# Patient Record
Sex: Male | Born: 1980 | Race: Black or African American | Hispanic: No | Marital: Single | Smoking: Never smoker
Health system: Southern US, Community
[De-identification: ages and names within clinical notes are randomized; demographics above are authoritative.]

## PROBLEM LIST (undated history)

## (undated) DIAGNOSIS — F840 Autistic disorder: Secondary | ICD-10-CM

---

## 2014-07-30 ENCOUNTER — Telehealth: Payer: Self-pay | Admitting: Family Medicine

## 2014-07-30 NOTE — Telephone Encounter (Signed)
Pt given appt with Lynwood Dawleyiffany Gann 4/20 at 10:00, pt aware to arrive 30 minutes prior with a copy of insurance card. Pt is autistic and needs a physical to perform in the [employer]. He isn't on any current medications.

## 2014-08-01 ENCOUNTER — Encounter: Payer: Self-pay | Admitting: Physician Assistant

## 2014-08-01 ENCOUNTER — Ambulatory Visit (INDEPENDENT_AMBULATORY_CARE_PROVIDER_SITE_OTHER): Payer: Medicaid Other | Admitting: Physician Assistant

## 2014-08-01 ENCOUNTER — Encounter (INDEPENDENT_AMBULATORY_CARE_PROVIDER_SITE_OTHER): Payer: Self-pay

## 2014-08-01 VITALS — BP 131/76 | HR 71 | Temp 97.3°F | Ht 72.0 in | Wt 209.0 lb

## 2014-08-01 DIAGNOSIS — Z0000 Encounter for general adult medical examination without abnormal findings: Secondary | ICD-10-CM

## 2014-08-01 DIAGNOSIS — Z025 Encounter for examination for participation in sport: Secondary | ICD-10-CM

## 2014-08-01 NOTE — Progress Notes (Signed)
   Subjective:    Patient ID: Donald Gallagher, male    DOB: [birth year], 34 y.o.   MRN: [id number]  HPI 34 y/o male presents for wellness exam prior to participating in the special Olympics. Asymptomatic today     Review of Systems  Constitutional: Negative.   HENT: Negative.   Eyes: Negative.   Respiratory: Negative.   Cardiovascular: Negative.   Gastrointestinal: Negative.   Endocrine: Negative.   Genitourinary: Negative.   Musculoskeletal: Negative.   Hematological: Negative.   Psychiatric/Behavioral: Negative.        Objective:   Physical Exam  Constitutional: He appears well-developed and well-nourished. No distress.  Pleasant   HENT:  Head: Normocephalic and atraumatic.  Right Ear: External ear normal.  Left Ear: External ear normal.  Mouth/Throat: Oropharynx is clear and moist.  Eyes: Right eye exhibits no discharge. Left eye exhibits no discharge.  Difficulty with following light Bilateral pupils ERRL  Neck: Normal range of motion.  Cardiovascular: Normal rate and regular rhythm.  Exam reveals no gallop and no friction rub.   No murmur heard. Abdominal: Soft. Bowel sounds are normal. He exhibits no distension and no mass. There is no tenderness. There is no rebound and no guarding.  Musculoskeletal: Normal range of motion.  Neurological: He is alert. Coordination abnormal.  Unable to follow all commands Difficulty concentrating  Skin: No rash noted. He is not diaphoretic. No erythema. No pallor.  Psychiatric: His behavior is normal.  Nursing note and vitals reviewed.         Assessment & Plan:  1. Encounter for examination to participate in special olympics Patient able to participate as normal with no restrictions.     Maitland Muhlbauer A. Harvir Patry PA-C    Donovin Kraemer A. Chauncey ReadingGann PA-C

## 2015-05-30 ENCOUNTER — Ambulatory Visit (INDEPENDENT_AMBULATORY_CARE_PROVIDER_SITE_OTHER): Payer: Medicaid Other | Admitting: Family

## 2015-05-30 ENCOUNTER — Encounter: Payer: Self-pay | Admitting: Family

## 2015-05-30 VITALS — BP 123/75 | HR 97 | Temp 97.2°F | Ht 72.0 in | Wt 209.0 lb

## 2015-05-30 DIAGNOSIS — F840 Autistic disorder: Secondary | ICD-10-CM

## 2015-05-30 DIAGNOSIS — J069 Acute upper respiratory infection, unspecified: Secondary | ICD-10-CM

## 2015-05-30 DIAGNOSIS — R6889 Other general symptoms and signs: Secondary | ICD-10-CM

## 2015-05-30 LAB — POCT INFLUENZA A/B
INFLUENZA A, POC: NEGATIVE
Influenza B, POC: NEGATIVE

## 2015-05-30 LAB — POCT RAPID STREP A (OFFICE): Rapid Strep A Screen: NEGATIVE

## 2015-05-30 MED ORDER — AZITHROMYCIN 250 MG PO TABS: ORAL_TABLET | ORAL | Status: DC

## 2015-05-30 MED ORDER — MOMETASONE FUROATE 50 MCG/ACT NA SUSP: 2.0000 | Freq: Every day | NASAL | Status: DC

## 2015-05-30 NOTE — Progress Notes (Signed)
Subjective:    Patient ID: Donald Gallagher, male    DOB: 05/24/1980, 35 y.o.   MRN: [id number]  Pt is autistic and presents to the office today with mother with complaints of sore throat and fever.  Fever  This is a new problem. The current episode started in the past 7 days (Saturday). The problem has been rapidly worsening. Associated symptoms include congestion, coughing, muscle aches, a sore throat and vomiting. Pertinent negatives include no diarrhea, headaches or nausea.  Cough Associated symptoms include a fever and a sore throat. Pertinent negatives include no headaches.  Sore Throat  This is a new problem. The current episode started in the past 7 days. The problem has been gradually worsening. The pain is moderate. Associated symptoms include congestion, coughing, swollen glands and vomiting. Pertinent negatives include no diarrhea or headaches. He has had no exposure to strep. He has tried acetaminophen and NSAIDs for the symptoms. The treatment provided moderate relief.  Emesis  Associated symptoms include coughing and a fever. Pertinent negatives include no diarrhea or headaches.      Review of Systems  Constitutional: Positive for fever.  HENT: Positive for congestion and sore throat.   Respiratory: Positive for cough.   Cardiovascular: Negative.   Gastrointestinal: Positive for vomiting. Negative for nausea and diarrhea.  Endocrine: Negative.   Genitourinary: Negative.   Musculoskeletal: Negative.   Neurological: Negative.  Negative for headaches.  Hematological: Negative.   Psychiatric/Behavioral: Negative.   All other systems reviewed and are negative.      Objective:   Physical Exam  Constitutional: He is oriented to person, place, and time. He appears well-developed and well-nourished. No distress.  HENT:  Head: Normocephalic.  Right Ear: External ear normal.  Left Ear: External ear normal.  Nasal passage erythemas with mild swelling  Oropharynx erythemas    Eyes: Pupils are equal, round, and reactive to light. Right eye exhibits no discharge. Left eye exhibits no discharge.  Neck: Normal range of motion. Neck supple. No thyromegaly present.  Cardiovascular: Normal rate, regular rhythm, normal heart sounds and intact distal pulses.   No murmur heard. Pulmonary/Chest: Effort normal and breath sounds normal. No respiratory distress. He has no wheezes.  Abdominal: Soft. Bowel sounds are normal. He exhibits no distension. There is no tenderness.  Musculoskeletal: Normal range of motion. He exhibits no edema or tenderness.  Neurological: He is alert and oriented to person, place, and time. He has normal reflexes. No cranial nerve deficit.  Skin: Skin is warm and dry. No rash noted. No erythema.  Psychiatric: He has a normal mood and affect. His behavior is normal. Judgment and thought content normal.  Vitals reviewed.     BP 123/75 mmHg  Pulse 97  Temp(Src) 97.2 F (36.2 C) (Oral)  Ht 6' (1.829 m)  Wt 209 lb (94.802 kg)  BMI 28.34 kg/m2     Assessment & Plan:  1. Flu-like symptoms - POCT Influenza A/B - POCT rapid strep A  2. Autistic disorder  3. Acute upper respiratory infection -- Take meds as prescribed - Use a cool mist humidifier  -Use saline nose sprays frequently -Saline irrigations of the nose can be very helpful if done frequently.  * 4X daily for 1 week*  * Use of a nettie pot can be helpful with this. Follow directions with this* -Force fluids -For any cough or congestion  Use plain Mucinex- regular strength or max strength is fine   * Children- consult with Pharmacist for dosing -For  fever or aces or pains- take tylenol or ibuprofen appropriate for age and weight.  * for fevers greater than 101 orally you may alternate ibuprofen and tylenol every  3 hours. -Throat lozenges if help -New toothbrush in 3 days -RTO prn - azithromycin (ZITHROMAX Z-PAK) 250 MG tablet; As directed  Dispense: 1 each; Refill: 0 -  mometasone (NASONEX) 50 MCG/ACT nasal spray; Place 2 sprays into the nose daily.  Dispense: 17 g; Refill: 12   Jannifer Rodney, FNP

## 2015-05-30 NOTE — Patient Instructions (Signed)
Upper Respiratory Infection, Adult Most upper respiratory infections (URIs) are a viral infection of the air passages leading to the lungs. A URI affects the nose, throat, and upper air passages. The most common type of URI is nasopharyngitis and is typically referred to as "the common cold." URIs run their course and usually go away on their own. Most of the time, a URI does not require medical attention, but sometimes a bacterial infection in the upper airways can follow a viral infection. This is called a secondary infection. Sinus and middle ear infections are common types of secondary upper respiratory infections. Bacterial pneumonia can also complicate a URI. A URI can worsen asthma and chronic obstructive pulmonary disease (COPD). Sometimes, these complications can require emergency medical care and may be life threatening.  CAUSES Almost all URIs are caused by viruses. A virus is a type of germ and can spread from one person to another.  RISKS FACTORS You may be at risk for a URI if:   You smoke.   You have chronic heart or lung disease.  You have a weakened defense (immune) system.   You are very young or very old.   You have nasal allergies or asthma.  You work in crowded or poorly ventilated areas.  You work in health care facilities or schools. SIGNS AND SYMPTOMS  Symptoms typically develop 2-3 days after you come in contact with a cold virus. Most viral URIs last 7-10 days. However, viral URIs from the influenza virus (flu virus) can last 14-18 days and are typically more severe. Symptoms may include:   Runny or stuffy (congested) nose.   Sneezing.   Cough.   Sore throat.   Headache.   Fatigue.   Fever.   Loss of appetite.   Pain in your forehead, behind your eyes, and over your cheekbones (sinus pain).  Muscle aches.  DIAGNOSIS  Your health care provider may diagnose a URI by:  Physical exam.  Tests to check that your symptoms are not due to  another condition such as:  Strep throat.  Sinusitis.  Pneumonia.  Asthma. TREATMENT  A URI goes away on its own with time. It cannot be cured with medicines, but medicines may be prescribed or recommended to relieve symptoms. Medicines may help:  Reduce your fever.  Reduce your cough.  Relieve nasal congestion. HOME CARE INSTRUCTIONS   Take medicines only as directed by your health care provider.   Gargle warm saltwater or take cough drops to comfort your throat as directed by your health care provider.  Use a warm mist humidifier or inhale steam from a shower to increase air moisture. This may make it easier to breathe.  Drink enough fluid to keep your urine clear or pale yellow.   Eat soups and other clear broths and maintain good nutrition.   Rest as needed.   Return to work when your temperature has returned to normal or as your health care provider advises. You may need to stay home longer to avoid infecting others. You can also use a face mask and careful hand washing to prevent spread of the virus.  Increase the usage of your inhaler if you have asthma.   Do not use any tobacco products, including cigarettes, chewing tobacco, or electronic cigarettes. If you need help quitting, ask your health care provider. PREVENTION  The best way to protect yourself from getting a cold is to practice good hygiene.   Avoid oral or hand contact with people with cold   symptoms.   Wash your hands often if contact occurs.  There is no clear evidence that vitamin C, vitamin E, echinacea, or exercise reduces the chance of developing a cold. However, it is always recommended to get plenty of rest, exercise, and practice good nutrition.  SEEK MEDICAL CARE IF:   You are getting worse rather than better.   Your symptoms are not controlled by medicine.   You have chills.  You have worsening shortness of breath.  You have brown or red mucus.  You have yellow or brown nasal  discharge.  You have pain in your face, especially when you bend forward.  You have a fever.  You have swollen neck glands.  You have pain while swallowing.  You have white areas in the back of your throat. SEEK IMMEDIATE MEDICAL CARE IF:   You have severe or persistent:  Headache.  Ear pain.  Sinus pain.  Chest pain.  You have chronic lung disease and any of the following:  Wheezing.  Prolonged cough.  Coughing up blood.  A change in your usual mucus.  You have a stiff neck.  You have changes in your:  Vision.  Hearing.  Thinking.  Mood. MAKE SURE YOU:   Understand these instructions.  Will watch your condition.  Will get help right away if you are not doing well or get worse.   This information is not intended to replace advice given to you by your health care provider. Make sure you discuss any questions you have with your health care provider.   Document Released: 09/23/2000 Document Revised: 08/14/2014 Document Reviewed: 07/05/2013 Elsevier Interactive Patient Education 2016 Elsevier Inc.  

## 2016-08-03 ENCOUNTER — Encounter: Payer: Self-pay | Admitting: Family

## 2016-08-03 ENCOUNTER — Ambulatory Visit (INDEPENDENT_AMBULATORY_CARE_PROVIDER_SITE_OTHER): Payer: Medicaid Other | Admitting: Family

## 2016-08-03 VITALS — BP 129/83 | HR 79 | Temp 97.2°F | Ht 70.5 in | Wt 209.4 lb

## 2016-08-03 DIAGNOSIS — Z23 Encounter for immunization: Secondary | ICD-10-CM

## 2016-08-03 DIAGNOSIS — F840 Autistic disorder: Secondary | ICD-10-CM

## 2016-08-03 DIAGNOSIS — Z0000 Encounter for general adult medical examination without abnormal findings: Secondary | ICD-10-CM

## 2016-08-03 NOTE — Progress Notes (Addendum)
   Subjective:    Patient ID: Donald Gallagher, male    DOB: Sep 16, 1980, 36 y.o.   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 PT is autisic, but currently not taking medications at this time. Pt denies any headache, palpitations, SOB, or edema at this time.    Review of Systems  All other systems reviewed and are negative.      Objective:   Physical Exam  Constitutional: He is oriented to person, place, and time. He appears well-developed and well-nourished. No distress.  HENT:  Head: Normocephalic.  Right Ear: External ear normal.  Left Ear: External ear normal.  Nose: Nose normal.  Mouth/Throat: Oropharynx is clear and moist.  Eyes: Pupils are equal, round, and reactive to light. Right eye exhibits no discharge. Left eye exhibits no discharge.  Neck: Normal range of motion. Neck supple. No thyromegaly present.  Cardiovascular: Normal rate, regular rhythm, normal heart sounds and intact distal pulses.   No murmur heard. Pulmonary/Chest: Effort normal and breath sounds normal. No respiratory distress. He has no wheezes.  Abdominal: Soft. Bowel sounds are normal. He exhibits no distension. There is no tenderness.  Musculoskeletal: Normal range of motion. He exhibits no edema or tenderness.  Neurological: He is alert and oriented to person, place, and time.  Skin: Skin is warm and dry. No rash noted. No erythema.  Psychiatric: He has a normal mood and affect. His behavior is normal. Judgment and thought content normal.  Vitals reviewed.   BP 129/83   Pulse 79   Temp 97.2 F (36.2 C) (Oral)   Ht 5' 10.5" (1.791 m)   Wt 209 lb 6.4 oz (95 kg)   BMI 29.62 kg/m        Assessment & Plan:  1. Autistic disorder - CMP14+EGFR  2. Annual physical exam - CMP14+EGFR - Lipid panel - Thyroid Panel With TSH - CBC with Differential/Platelet   Continue all meds Labs pending Health Maintenance reviewed Diet and  exercise encouraged RTO 1 year and noted given to participate in [place], [place]

## 2016-08-03 NOTE — Patient Instructions (Signed)
 Health Maintenance, Male A healthy lifestyle and preventive care is important for your health and wellness. Ask your health care provider about what schedule of regular examinations is right for you. What should I know about weight and diet?  Eat a Healthy Diet  Eat plenty of vegetables, fruits, whole grains, low-fat dairy products, and lean protein.  Do not eat a lot of foods high in solid fats, added sugars, or salt. Maintain a Healthy Weight  Regular exercise can help you achieve or maintain a healthy weight. You should:  Do at least 150 minutes of exercise each week. The exercise should increase your heart rate and make you sweat (moderate-intensity exercise).  Do strength-training exercises at least twice a week. Watch Your Levels of Cholesterol and Blood Lipids  Have your blood tested for lipids and cholesterol every 5 years starting at 37 years of age. If you are at high risk for heart disease, you should start having your blood tested when you are 37 years old. You may need to have your cholesterol levels checked more often if:  Your lipid or cholesterol levels are high.  You are older than 37 years of age.  You are at high risk for heart disease. What should I know about cancer screening? Many types of cancers can be detected early and may often be prevented. Lung Cancer  You should be screened every year for lung cancer if:  You are a current smoker who has smoked for at least 30 years.  You are a former smoker who has quit within the past 15 years.  Talk to your health care provider about your screening options, when you should start screening, and how often you should be screened. Colorectal Cancer  Routine colorectal cancer screening usually begins at 37 years of age and should be repeated every 5-10 years until you are 37 years old. You may need to be screened more often if early forms of precancerous polyps or small growths are found. Your health care provider  may recommend screening at an earlier age if you have risk factors for colon cancer.  Your health care provider may recommend using home test kits to check for hidden blood in the stool.  A small camera at the end of a tube can be used to examine your colon (sigmoidoscopy or colonoscopy). This checks for the earliest forms of colorectal cancer. Prostate and Testicular Cancer  Depending on your age and overall health, your health care provider may do certain tests to screen for prostate and testicular cancer.  Talk to your health care provider about any symptoms or concerns you have about testicular or prostate cancer. Skin Cancer  Check your skin from head to toe regularly.  Tell your health care provider about any new moles or changes in moles, especially if:  There is a change in a mole's size, shape, or color.  You have a mole that is larger than a pencil eraser.  Always use sunscreen. Apply sunscreen liberally and repeat throughout the day.  Protect yourself by wearing long sleeves, pants, a wide-brimmed hat, and sunglasses when outside. What should I know about heart disease, diabetes, and high blood pressure?  If you are 18-39 years of age, have your blood pressure checked every 3-5 years. If you are 40 years of age or older, have your blood pressure checked every year. You should have your blood pressure measured twice-once when you are at a hospital or clinic, and once when you are not at   a hospital or clinic. Record the average of the two measurements. To check your blood pressure when you are not at a hospital or clinic, you can use:  An automated blood pressure machine at a pharmacy.  A home blood pressure monitor.  Talk to your health care provider about your target blood pressure.  If you are between 45-79 years old, ask your health care provider if you should take aspirin to prevent heart disease.  Have regular diabetes screenings by checking your fasting blood sugar  level.  If you are at a normal weight and have a low risk for diabetes, have this test once every three years after the age of 45.  If you are overweight and have a high risk for diabetes, consider being tested at a younger age or more often.  A one-time screening for abdominal aortic aneurysm (AAA) by ultrasound is recommended for men aged 65-75 years who are current or former smokers. What should I know about preventing infection? Hepatitis B  If you have a higher risk for hepatitis B, you should be screened for this virus. Talk with your health care provider to find out if you are at risk for hepatitis B infection. Hepatitis C  Blood testing is recommended for:  Everyone born from 1945 through 1965.  Anyone with known risk factors for hepatitis C. Sexually Transmitted Diseases (STDs)  You should be screened each year for STDs including gonorrhea and chlamydia if:  You are sexually active and are younger than 37 years of age.  You are older than 37 years of age and your health care provider tells you that you are at risk for this type of infection.  Your sexual activity has changed since you were last screened and you are at an increased risk for chlamydia or gonorrhea. Ask your health care provider if you are at risk.  Talk with your health care provider about whether you are at high risk of being infected with HIV. Your health care provider may recommend a prescription medicine to help prevent HIV infection. What else can I do?  Schedule regular health, dental, and eye exams.  Stay current with your vaccines (immunizations).  Do not use any tobacco products, such as cigarettes, chewing tobacco, and e-cigarettes. If you need help quitting, ask your health care provider.  Limit alcohol intake to no more than 2 drinks per day. One drink equals 12 ounces of beer, 5 ounces of wine, or 1 ounces of hard liquor.  Do not use street drugs.  Do not share needles.  Ask your health  care provider for help if you need support or information about quitting drugs.  Tell your health care provider if you often feel depressed.  Tell your health care provider if you have ever been abused or do not feel safe at home. This information is not intended to replace advice given to you by your health care provider. Make sure you discuss any questions you have with your health care provider. Document Released: 09/26/2007 Document Revised: 11/27/2015 Document Reviewed: 01/01/2015 Elsevier Interactive Patient Education  2017 Elsevier Inc.  

## 2016-08-03 NOTE — Addendum Note (Signed)
Addended by: Almeta Monas on: 08/03/2016 12:58 PM   Modules accepted: Orders

## 2016-08-04 LAB — CBC WITH DIFFERENTIAL/PLATELET
BASOS ABS: 0 10*3/uL (ref 0.0–0.2)
Basos: 0 %
EOS (ABSOLUTE): 0.1 10*3/uL (ref 0.0–0.4)
Eos: 3 %
Hematocrit: 41.8 % (ref 37.5–51.0)
Hemoglobin: 14.3 g/dL (ref 13.0–17.7)
Immature Grans (Abs): 0 10*3/uL (ref 0.0–0.1)
Immature Granulocytes: 0 %
LYMPHS ABS: 2.1 10*3/uL (ref 0.7–3.1)
Lymphs: 40 %
MCH: 27.6 pg (ref 26.6–33.0)
MCHC: 34.2 g/dL (ref 31.5–35.7)
MCV: 81 fL (ref 79–97)
MONOS ABS: 0.6 10*3/uL (ref 0.1–0.9)
Monocytes: 11 %
NEUTROS ABS: 2.4 10*3/uL (ref 1.4–7.0)
Neutrophils: 46 %
Platelets: 224 10*3/uL (ref 150–379)
RBC: 5.19 x10E6/uL (ref 4.14–5.80)
RDW: 13.7 % (ref 12.3–15.4)
WBC: 5.2 10*3/uL (ref 3.4–10.8)

## 2016-08-04 LAB — CMP14+EGFR
A/G RATIO: 1.5 (ref 1.2–2.2)
ALT: 26 IU/L (ref 0–44)
AST: 20 IU/L (ref 0–40)
Albumin: 4.4 g/dL (ref 3.5–5.5)
Alkaline Phosphatase: 86 IU/L (ref 39–117)
BILIRUBIN TOTAL: 0.3 mg/dL (ref 0.0–1.2)
BUN / CREAT RATIO: 11 (ref 9–20)
BUN: 10 mg/dL (ref 6–20)
CHLORIDE: 102 mmol/L (ref 96–106)
CO2: 21 mmol/L (ref 18–29)
Calcium: 9.1 mg/dL (ref 8.7–10.2)
Creatinine, Ser: 0.93 mg/dL (ref 0.76–1.27)
GFR calc non Af Amer: 105 mL/min/{1.73_m2} (ref >59)
GFR, EST AFRICAN AMERICAN: 122 mL/min/{1.73_m2} (ref >59)
GLOBULIN, TOTAL: 2.9 g/dL (ref 1.5–4.5)
Glucose: 90 mg/dL (ref 65–99)
POTASSIUM: 4.4 mmol/L (ref 3.5–5.2)
SODIUM: 141 mmol/L (ref 134–144)
TOTAL PROTEIN: 7.3 g/dL (ref 6.0–8.5)

## 2016-08-04 LAB — LIPID PANEL
Chol/HDL Ratio: 4.4 ratio (ref 0.0–5.0)
Cholesterol, Total: 208 mg/dL — ABNORMAL HIGH (ref 100–199)
HDL: 47 mg/dL (ref >39)
Triglycerides: 426 mg/dL — ABNORMAL HIGH (ref 0–149)

## 2016-08-04 LAB — THYROID PANEL WITH TSH
FREE THYROXINE INDEX: 1.6 (ref 1.2–4.9)
T3 UPTAKE RATIO: 27 % (ref 24–39)
T4 TOTAL: 6 ug/dL (ref 4.5–12.0)
TSH: 2.08 u[IU]/mL (ref 0.450–4.500)

## 2017-02-17 ENCOUNTER — Ambulatory Visit (INDEPENDENT_AMBULATORY_CARE_PROVIDER_SITE_OTHER): Payer: Medicaid Other

## 2017-02-17 DIAGNOSIS — Z23 Encounter for immunization: Secondary | ICD-10-CM | POA: Diagnosis not present

## 2017-07-19 ENCOUNTER — Encounter: Payer: Self-pay | Admitting: Nurse Practitioner

## 2017-07-19 ENCOUNTER — Ambulatory Visit: Payer: Medicaid Other | Admitting: Nurse Practitioner

## 2017-07-19 VITALS — BP 125/78 | HR 84 | Temp 96.6°F | Ht 70.0 in | Wt 225.0 lb

## 2017-07-19 DIAGNOSIS — R05 Cough: Secondary | ICD-10-CM

## 2017-07-19 DIAGNOSIS — R059 Cough, unspecified: Secondary | ICD-10-CM

## 2017-07-19 MED ORDER — BENZONATATE 100 MG PO CAPS: 100.0000 mg | ORAL_CAPSULE | Freq: Three times a day (TID) | ORAL | 0 refills | Status: DC | PRN

## 2017-07-19 MED ORDER — CETIRIZINE HCL 10 MG PO TABS: 10.0000 mg | ORAL_TABLET | Freq: Every day | ORAL | 11 refills | Status: DC

## 2017-07-19 NOTE — Progress Notes (Signed)
   Subjective:    Patient ID: Donald Gallagher, male    DOB: 08/25/1980, 37 y.o.   MRN: [id number]  HPI Patient is brought in by his mom. He is autistic so information was obtained by mom. She says that he has a bad cough. started about 2 weeks ago. No fever. She has given him thera flu and robitussin with no relief. Does not cough much at night.    Review of Systems  Constitutional: Negative for appetite change, chills and fever.  HENT: Negative for congestion, ear pain, postnasal drip, sinus pressure, sinus pain, sore throat and trouble swallowing.   Respiratory: Positive for cough.   Cardiovascular: Negative.   Gastrointestinal: Negative.   Genitourinary: Negative.   Neurological: Negative.   Psychiatric/Behavioral: Negative.   All other systems reviewed and are negative.      Objective:   Physical Exam  Constitutional: He is oriented to person, place, and time. He appears well-developed and well-nourished. No distress.  HENT:  Right Ear: External ear normal.  Left Ear: External ear normal.  Nose: Nose normal.  Mouth/Throat: Oropharynx is clear and moist.  Neck: Normal range of motion. Neck supple.  Cardiovascular: Normal rate and regular rhythm.  Pulmonary/Chest: Effort normal and breath sounds normal.  Lymphadenopathy:    He has no cervical adenopathy.  Neurological: He is alert and oriented to person, place, and time.  Skin: Skin is warm.  Psychiatric: He has a normal mood and affect. His behavior is normal. Judgment and thought content normal.    BP 125/78   Pulse 84   Temp (!) 96.6 F (35.9 C) (Oral)   Ht 5\' 10"  (1.778 m)   Wt 225 lb (102.1 kg)   BMI 32.28 kg/m        Assessment & Plan:   1. Cough    Meds ordered this encounter  Medications  . cetirizine (ZYRTEC) 10 MG tablet    Sig: Take 1 tablet (10 mg total) by mouth daily.    Dispense:  30 tablet    Refill:  11    Order Specific Question:   Supervising Provider    Answer:   Oswaldo DoneVINCENT, CAROL L [[id number]]    . benzonatate (TESSALON PERLES) 100 MG capsule    Sig: Take 1 capsule (100 mg total) by mouth 3 (three) times daily as needed for cough.    Dispense:  20 capsule    Refill:  0    Order Specific Question:   Supervising Provider    Answer:   Johna SheriffVINCENT, CAROL L [[id number]]   Force fluids Run humidifier in house RTO prn  Mary-Margaret Daphine DeutscherMartin, FNP

## 2017-07-19 NOTE — Patient Instructions (Signed)

## 2017-07-28 ENCOUNTER — Telehealth: Payer: Self-pay | Admitting: Family

## 2017-07-28 NOTE — Telephone Encounter (Signed)
Encourage lots of fluids.  Can try Flonase nasal steroid spray if he seems to be having some allergy symptoms with nasal congestion.  If not getting better he needs to be seen.

## 2017-07-28 NOTE — Telephone Encounter (Signed)
Aware. 

## 2017-07-28 NOTE — Telephone Encounter (Signed)
Pt having a hacky cough entire day, has small amt of phlegm, not very thick mom says, no fever. Bensonatate & Cetirizine has not helped Please advise

## 2018-02-08 ENCOUNTER — Ambulatory Visit: Payer: Medicaid Other | Admitting: Family

## 2018-02-08 ENCOUNTER — Encounter: Payer: Self-pay | Admitting: Family

## 2018-02-08 VITALS — BP 125/82 | HR 84 | Temp 98.8°F | Ht 70.0 in | Wt 222.6 lb

## 2018-02-08 DIAGNOSIS — Z23 Encounter for immunization: Secondary | ICD-10-CM | POA: Diagnosis not present

## 2018-02-08 DIAGNOSIS — Z0000 Encounter for general adult medical examination without abnormal findings: Secondary | ICD-10-CM

## 2018-02-08 DIAGNOSIS — E669 Obesity, unspecified: Secondary | ICD-10-CM

## 2018-02-08 DIAGNOSIS — F840 Autistic disorder: Secondary | ICD-10-CM | POA: Diagnosis not present

## 2018-02-08 NOTE — Patient Instructions (Signed)

## 2018-02-08 NOTE — Progress Notes (Signed)
   Subjective:    Patient ID: Donald Gallagher, male    DOB: February 21, 1981, 37 y.o.   MRN: [id number]  Chief Complaint  Patient presents with  . Annual Exam    HPI PT presents to the office today for CPE. PT is autistic. He is currently not taking any medications at this time and denies any pain. Pt lives with his mother, who brought him in today. Pt denies any headache, palpitations, SOB, or edema at this time.     Review of Systems  All other systems reviewed and are negative.      Objective:   Physical Exam  Constitutional: He is oriented to person, place, and time. He appears well-developed and well-nourished. No distress.  HENT:  Head: Normocephalic.  Right Ear: External ear normal.  Left Ear: External ear normal.  Mouth/Throat: Oropharynx is clear and moist.  Eyes: Pupils are equal, round, and reactive to light. Right eye exhibits no discharge. Left eye exhibits no discharge.  Neck: Normal range of motion. Neck supple. No thyromegaly present.  Cardiovascular: Normal rate, regular rhythm, normal heart sounds and intact distal pulses.  No murmur heard. Pulmonary/Chest: Effort normal and breath sounds normal. No respiratory distress. He has no wheezes.  Abdominal: Soft. Bowel sounds are normal. He exhibits no distension. There is no tenderness.  Musculoskeletal: Normal range of motion. He exhibits no edema or tenderness.  Neurological: He is alert and oriented to person, place, and time. He has normal reflexes. No cranial nerve deficit.  Skin: Skin is warm and dry. No rash noted. No erythema.  Psychiatric: He has a normal mood and affect. Thought content normal. He is withdrawn. He expresses inappropriate judgment.  Vitals reviewed.    BP 125/82   Pulse 84   Temp 98.8 F (37.1 C) (Oral)   Ht '5\' 10"'$  (1.778 m)   Wt 222 lb 9.6 oz (101 kg)   BMI 31.94 kg/m      Assessment & Plan:  Donald Gallagher comes in today with chief complaint of Annual Exam   Diagnosis and orders  addressed:  1. Annual physical exam - CBC with Differential/Platelet - CMP14+EGFR - TSH  2. Autistic disorder - CBC with Differential/Platelet - CMP14+EGFR  3. Obesity (BMI 30-39.9) - CBC with Differential/Platelet - CMP14+EGFR  4. Need for immunization against influenza - Flu Vaccine QUAD 36+ mos IM   Labs pending Health Maintenance reviewed Diet and exercise encouraged  Follow up plan: 1 year  Evelina Dun, FNP

## 2018-02-09 LAB — CBC WITH DIFFERENTIAL/PLATELET
BASOS: 0 %
Basophils Absolute: 0 10*3/uL (ref 0.0–0.2)
EOS (ABSOLUTE): 0.2 10*3/uL (ref 0.0–0.4)
EOS: 3 %
HEMOGLOBIN: 15.2 g/dL (ref 13.0–17.7)
Hematocrit: 44.4 % (ref 37.5–51.0)
IMMATURE GRANS (ABS): 0 10*3/uL (ref 0.0–0.1)
IMMATURE GRANULOCYTES: 0 %
LYMPHS: 46 %
Lymphocytes Absolute: 2.5 10*3/uL (ref 0.7–3.1)
MCH: 28.1 pg (ref 26.6–33.0)
MCHC: 34.2 g/dL (ref 31.5–35.7)
MCV: 82 fL (ref 79–97)
MONOCYTES: 13 %
Monocytes Absolute: 0.7 10*3/uL (ref 0.1–0.9)
NEUTROS ABS: 2.1 10*3/uL (ref 1.4–7.0)
NEUTROS PCT: 38 %
PLATELETS: 251 10*3/uL (ref 150–450)
RBC: 5.4 x10E6/uL (ref 4.14–5.80)
RDW: 13.8 % (ref 12.3–15.4)
WBC: 5.6 10*3/uL (ref 3.4–10.8)

## 2018-02-09 LAB — CMP14+EGFR
A/G RATIO: 1.6 (ref 1.2–2.2)
ALBUMIN: 4.4 g/dL (ref 3.5–5.5)
ALT: 46 IU/L — AB (ref 0–44)
AST: 30 IU/L (ref 0–40)
Alkaline Phosphatase: 92 IU/L (ref 39–117)
BILIRUBIN TOTAL: 0.3 mg/dL (ref 0.0–1.2)
BUN / CREAT RATIO: 11 (ref 9–20)
BUN: 10 mg/dL (ref 6–20)
CALCIUM: 9.3 mg/dL (ref 8.7–10.2)
CO2: 22 mmol/L (ref 20–29)
Chloride: 102 mmol/L (ref 96–106)
Creatinine, Ser: 0.9 mg/dL (ref 0.76–1.27)
GFR calc non Af Amer: 109 mL/min/{1.73_m2} (ref >59)
GFR, EST AFRICAN AMERICAN: 126 mL/min/{1.73_m2} (ref >59)
Globulin, Total: 2.8 g/dL (ref 1.5–4.5)
Glucose: 96 mg/dL (ref 65–99)
POTASSIUM: 4 mmol/L (ref 3.5–5.2)
Sodium: 140 mmol/L (ref 134–144)
TOTAL PROTEIN: 7.2 g/dL (ref 6.0–8.5)

## 2018-02-09 LAB — TSH: TSH: 2.67 u[IU]/mL (ref 0.450–4.500)

## 2018-06-14 ENCOUNTER — Encounter: Payer: Self-pay | Admitting: Family

## 2018-06-14 ENCOUNTER — Ambulatory Visit (INDEPENDENT_AMBULATORY_CARE_PROVIDER_SITE_OTHER): Payer: Medicare Other | Admitting: Family

## 2018-06-14 VITALS — BP 134/91 | HR 78 | Temp 96.6°F | Ht 70.0 in | Wt 221.6 lb

## 2018-06-14 DIAGNOSIS — F840 Autistic disorder: Secondary | ICD-10-CM

## 2018-06-14 DIAGNOSIS — Z025 Encounter for examination for participation in sport: Secondary | ICD-10-CM | POA: Diagnosis not present

## 2018-06-14 NOTE — Progress Notes (Signed)
   Subjective:    Patient ID: Donald Gallagher, male    DOB: 1980-04-25, 38 y.o.   MRN: [id number]  Chief Complaint  Patient presents with  . Special [employer] form    HPI Pt presents to the office today to have special olympic paperwork completed. He is autistic and is brought in by his mother.   He has competed in the 200 meter dash and won in the past. No complaints today.    Review of Systems  All other systems reviewed and are negative.      Objective:   Physical Exam Vitals signs reviewed.  Constitutional:      General: He is not in acute distress.    Appearance: He is well-developed.  HENT:     Head: Normocephalic.     Right Ear: Tympanic membrane and external ear normal.     Left Ear: Tympanic membrane and external ear normal.  Eyes:     General:        Right eye: No discharge.        Left eye: No discharge.     Pupils: Pupils are equal, round, and reactive to light.  Neck:     Musculoskeletal: Normal range of motion and neck supple.     Thyroid: No thyromegaly.  Cardiovascular:     Rate and Rhythm: Normal rate and regular rhythm.     Heart sounds: Normal heart sounds. No murmur.  Pulmonary:     Effort: Pulmonary effort is normal. No respiratory distress.     Breath sounds: Normal breath sounds. No wheezing.  Abdominal:     General: Bowel sounds are normal. There is no distension.     Palpations: Abdomen is soft.     Tenderness: There is no abdominal tenderness.  Musculoskeletal: Normal range of motion.        General: No tenderness.  Skin:    General: Skin is warm and dry.     Findings: No erythema or rash.  Neurological:     Mental Status: He is alert and oriented to person, place, and time.     Cranial Nerves: No cranial nerve deficit.     Deep Tendon Reflexes: Reflexes are normal and symmetric.  Psychiatric:        Behavior: Behavior is slowed.        Thought Content: Thought content normal.        Judgment: Judgment normal.       BP (!) 134/91    Pulse 78   Temp (!) 96.6 F (35.9 C) (Oral)   Ht 5\' 10"  (1.778 m)   Wt 221 lb 9.6 oz (100.5 kg)   SpO2 98%   BMI 31.80 kg/m      Assessment & Plan:  Bismark Steveson comes in today with chief complaint of [employer] form   Diagnosis and orders addressed:  1. 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 Autistic disorder  Special Olympic paperwork completed Keep chronic follow up RTO as needed  Jannifer Rodney, FNP

## 2018-09-02 ENCOUNTER — Other Ambulatory Visit: Payer: Self-pay | Admitting: Nurse Practitioner

## 2018-09-02 DIAGNOSIS — R059 Cough, unspecified: Secondary | ICD-10-CM

## 2018-09-02 DIAGNOSIS — R05 Cough: Secondary | ICD-10-CM

## 2019-01-09 ENCOUNTER — Other Ambulatory Visit: Payer: Self-pay

## 2019-01-09 ENCOUNTER — Ambulatory Visit (INDEPENDENT_AMBULATORY_CARE_PROVIDER_SITE_OTHER): Payer: Medicare Other

## 2019-01-09 DIAGNOSIS — Z23 Encounter for immunization: Secondary | ICD-10-CM

## 2019-05-08 ENCOUNTER — Other Ambulatory Visit: Payer: Self-pay | Admitting: Family

## 2019-05-08 DIAGNOSIS — R05 Cough: Secondary | ICD-10-CM

## 2019-05-08 DIAGNOSIS — R059 Cough, unspecified: Secondary | ICD-10-CM

## 2019-08-07 ENCOUNTER — Other Ambulatory Visit: Payer: Self-pay | Admitting: Family

## 2019-08-07 DIAGNOSIS — R05 Cough: Secondary | ICD-10-CM

## 2019-08-07 DIAGNOSIS — R059 Cough, unspecified: Secondary | ICD-10-CM

## 2019-10-02 ENCOUNTER — Other Ambulatory Visit: Payer: Self-pay | Admitting: Family

## 2019-10-02 DIAGNOSIS — R059 Cough, unspecified: Secondary | ICD-10-CM

## 2020-02-13 ENCOUNTER — Other Ambulatory Visit: Payer: Self-pay

## 2020-02-13 ENCOUNTER — Ambulatory Visit (INDEPENDENT_AMBULATORY_CARE_PROVIDER_SITE_OTHER): Payer: Medicare Other

## 2020-02-13 DIAGNOSIS — Z23 Encounter for immunization: Secondary | ICD-10-CM | POA: Diagnosis not present

## 2020-04-03 ENCOUNTER — Other Ambulatory Visit: Payer: Self-pay

## 2020-04-03 ENCOUNTER — Ambulatory Visit (INDEPENDENT_AMBULATORY_CARE_PROVIDER_SITE_OTHER): Payer: Medicare Other

## 2020-04-03 DIAGNOSIS — Z23 Encounter for immunization: Secondary | ICD-10-CM | POA: Diagnosis not present

## 2020-04-03 NOTE — Progress Notes (Signed)
   Covid-19 Vaccination Clinic  Name:  Donald Gallagher    MRN: [id number] DOB: [birth year]  04/03/2020  [name] was observed post Covid-19 immunization for 15 minutes without incident. He was provided with Vaccine Information Sheet and instruction to access the V-Safe system.   [name] was instructed to call 911 with any severe reactions post vaccine: Marland Kitchen Difficulty breathing  . Swelling of face and throat  . A fast heartbeat  . A bad rash all over body  . Dizziness and weakness   Immunizations Administered    Name Date Dose VIS Date Route   Moderna Covid-19 Booster Vaccine 04/03/2020 10:56 AM 0.25 mL 01/31/2020 Intramuscular   Manufacturer: Gala Murdoch   Lot: [id number]   NDC: 99357-017-79

## 2021-04-01 DIAGNOSIS — Z23 Encounter for immunization: Secondary | ICD-10-CM | POA: Diagnosis not present

## 2021-04-23 ENCOUNTER — Telehealth: Payer: Self-pay | Admitting: Family

## 2021-08-02 ENCOUNTER — Ambulatory Visit
Admission: EM | Admit: 2021-08-02 | Discharge: 2021-08-02 | Disposition: A | Discharge status: Home / Self Care | Payer: Medicare Other | Attending: Family Medicine | Admitting: Family Medicine

## 2021-08-02 DIAGNOSIS — J209 Acute bronchitis, unspecified: Secondary | ICD-10-CM

## 2021-08-02 MED ORDER — ALBUTEROL SULFATE HFA 108 (90 BASE) MCG/ACT IN AERS: 1.0000 | INHALATION_SPRAY | Freq: Four times a day (QID) | RESPIRATORY_TRACT | 0 refills | Status: DC | PRN

## 2021-08-02 MED ORDER — PROMETHAZINE-DM 6.25-15 MG/5ML PO SYRP: 5.0000 mL | ORAL_SOLUTION | Freq: Four times a day (QID) | ORAL | 0 refills | Status: DC | PRN

## 2021-08-02 MED ORDER — PREDNISONE 20 MG PO TABS: 40.0000 mg | ORAL_TABLET | Freq: Every day | ORAL | 0 refills | Status: DC

## 2021-08-02 NOTE — ED Provider Notes (Signed)
?[facility] ? ? ? ?CSN: [id number] ?Arrival date & time: 08/02/21  0803 ? ? ?  ? ?History   ?Chief Complaint ?Chief Complaint  ?Patient presents with  ? Cough  ?  Sore throat and cough  ? ? ?HPI ?Donald Gallagher is a 41 y.o. male.  ? ?History provided and followed by sister who is present with him today and is a primary caregiver.  Presenting today with hacking persistent cough that has been worsening for the past week.  He has been having coughing fits that make it hard for him to catch his breath and feeling short of breath at times.  Caregiver denies notice of fever, nasal congestion, vomiting, diarrhea, rashes.  He does have a history of seasonal allergies on Zyrtec daily, no history of chronic pulmonary disease.  No new sick contacts known.  Trying DayQuil and NyQuil with no relief. ? ? ?History reviewed. No pertinent past medical history. ? ?Patient Active Problem List  ? Diagnosis Date Noted  ? Obesity (BMI 30-39.9) 02/08/2018  ? Autistic disorder 05/30/2015  ? ? ?History reviewed. No pertinent surgical history. ? ? ? ? ?Home Medications   ? ?Prior to Admission medications   ?Medication Sig Start Date End Date Taking? Authorizing Provider  ?albuterol (VENTOLIN HFA) 108 (90 Base) MCG/ACT inhaler Inhale 1-2 puffs into the lungs every 6 (six) hours as needed for wheezing or shortness of breath. 08/02/21  Yes Particia Nearing, PA-C  ?predniSONE (DELTASONE) 20 MG tablet Take 2 tablets (40 mg total) by mouth daily with breakfast. 08/02/21  Yes Particia Nearing, PA-C  ?promethazine-dextromethorphan (PROMETHAZINE-DM) 6.25-15 MG/5ML syrup Take 5 mLs by mouth 4 (four) times daily as needed. 08/02/21  Yes Particia Nearing, PA-C  ?cetirizine (ZYRTEC) 10 MG tablet Take 1 tablet by mouth once daily 05/08/19   Junie Spencer, FNP  ? ? ?Family History ?Family History  ?Problem Relation Age of Onset  ? Diabetes Father   ? ? ?Social History ?Social History  ? ?Tobacco Use  ? Smoking status: Never  ?  Smokeless tobacco: Never  ?Vaping Use  ? Vaping Use: Never used  ?Substance Use Topics  ? Alcohol use: No  ?  Alcohol/week: 0.0 standard drinks  ? Drug use: No  ? ? ? ?Allergies   ?Patient has no known allergies. ? ? ?Review of Systems ?Review of Systems ?Per HPI ? ?Physical Exam ?Triage Vital Signs ?ED Triage Vitals  ?Enc Vitals Group  ?   BP 08/02/21 0816 124/85  ?   Pulse Rate 08/02/21 0816 77  ?   Resp 08/02/21 0816 18  ?   Temp 08/02/21 0816 98 ?F (36.7 ?C)  ?   Temp Source 08/02/21 0816 Oral  ?   SpO2 08/02/21 0816 98 %  ?   Weight --   ?   Height --   ?   Head Circumference --   ?   Peak Flow --   ?   Pain Score 08/02/21 0815 0  ?   Pain Loc --   ?   Pain Edu? --   ?   Excl. in GC? --   ? ?No data found. ? ?Updated Vital Signs ?BP 124/85 (BP Location: Right Arm)   Pulse 77   Temp 98 ?F (36.7 ?C) (Oral)   Resp 18   SpO2 98%  ? ?Visual Acuity ?Right Eye Distance:   ?Left Eye Distance:   ?Bilateral Distance:   ? ?Right Eye Near:   ?  Left Eye Near:    ?Bilateral Near:    ? ?Physical Exam ?Vitals and nursing note reviewed.  ?Constitutional:   ?   Appearance: He is well-developed.  ?HENT:  ?   Head: Atraumatic.  ?   Right Ear: External ear normal.  ?   Left Ear: External ear normal.  ?   Nose: Nose normal.  ?   Mouth/Throat:  ?   Pharynx: Posterior oropharyngeal erythema present. No oropharyngeal exudate.  ?Eyes:  ?   Conjunctiva/sclera: Conjunctivae normal.  ?   Pupils: Pupils are equal, round, and reactive to light.  ?Cardiovascular:  ?   Rate and Rhythm: Normal rate and regular rhythm.  ?   Heart sounds: Normal heart sounds.  ?Pulmonary:  ?   Effort: Pulmonary effort is normal. No respiratory distress.  ?   Breath sounds: No wheezing or rales.  ?Musculoskeletal:     ?   General: Normal range of motion.  ?   Cervical back: Normal range of motion and neck supple.  ?Lymphadenopathy:  ?   Cervical: No cervical adenopathy.  ?Skin: ?   General: Skin is warm and dry.  ?Neurological:  ?   Mental Status: He is alert.  Mental status is at baseline.  ?Psychiatric:  ?   Comments: At baseline  ? ? ?UC Treatments / Results  ?Labs ?(all labs ordered are listed, but only abnormal results are displayed) ?Labs Reviewed - No data to display ? ?EKG ? ? ?Radiology ?No results found. ? ?Procedures ?Procedures (including critical care time) ? ?Medications Ordered in UC ?Medications - No data to display ? ?Initial Impression / Assessment and Plan / UC Course  ?I have reviewed the triage vital signs and the nursing notes. ? ?Pertinent labs & imaging results that were available during my care of the patient were reviewed by me and considered in my medical decision making (see chart for details). ? ?  ? ?Vital signs benign and reassuring, suspect bronchitis either from seasonal allergies or viral infection.  COVID and flu declined given duration of symptoms.  Treat with prednisone, Phenergan DM, albuterol inhaler and continue allergy regimen.  Return for worsening symptoms. ? ?Final Clinical Impressions(s) / UC Diagnoses  ? ?Final diagnoses:  ?Acute bronchitis, unspecified organism  ? ?Discharge Instructions   ?None ?  ? ?ED Prescriptions   ? ? Medication Sig Dispense Auth. Provider  ? predniSONE (DELTASONE) 20 MG tablet Take 2 tablets (40 mg total) by mouth daily with breakfast. 10 tablet Particia Nearing, PA-C  ? promethazine-dextromethorphan (PROMETHAZINE-DM) 6.25-15 MG/5ML syrup Take 5 mLs by mouth 4 (four) times daily as needed. 100 mL Particia Nearing, [place]  ? albuterol (VENTOLIN HFA) 108 (90 Base) MCG/ACT inhaler Inhale 1-2 puffs into the lungs every 6 (six) hours as needed for wheezing or shortness of breath. 18 g Particia Nearing, [place]  ? ?  ? ?PDMP not reviewed this encounter. ?  ?Particia Nearing, PA-C ?08/02/21 3419 ? ?

## 2021-08-02 NOTE — ED Triage Notes (Signed)
Pt's Sister states he has ad a bad cough for a week  ? ?Pt's sister states they have tried Nyquil and dayquil without any relief ? ?Pt states his chest is sore from the dry cough ? ?Denies Fever ?

## 2021-08-19 ENCOUNTER — Encounter: Payer: Self-pay | Admitting: Family Medicine

## 2021-08-19 ENCOUNTER — Ambulatory Visit (INDEPENDENT_AMBULATORY_CARE_PROVIDER_SITE_OTHER): Payer: Medicare Other | Admitting: Family Medicine

## 2021-08-19 VITALS — BP 124/81 | HR 94 | Temp 98.1°F | Ht 70.0 in | Wt 191.6 lb

## 2021-08-19 DIAGNOSIS — R051 Acute cough: Secondary | ICD-10-CM | POA: Diagnosis not present

## 2021-08-19 MED ORDER — BENZONATATE 100 MG PO CAPS: 200.0000 mg | ORAL_CAPSULE | Freq: Three times a day (TID) | ORAL | 0 refills | Status: DC | PRN

## 2021-08-19 NOTE — Progress Notes (Signed)
? ?Assessment & Plan:  ?1. Acute cough ?Discussed coughs from bronchitis can tend to linger a little longer.  Prescribed Tessalon Perles as needed. ?- benzonatate (TESSALON PERLES) 100 MG capsule; Take 2 capsules (200 mg total) by mouth 3 (three) times daily as needed for cough.  Dispense: 30 capsule; Refill: 0 ? ? ?Follow up plan: Return if symptoms worsen or fail to improve. ? ?Donald Boston, MSN, APRN, FNP-C ?[facility] ? ?Subjective:  ? ?Patient ID: Donald Gallagher, male    DOB: 1981/01/03, 41 y.o.   MRN: [id number] ? ?HPI: ?Donald Gallagher is a 41 y.o. male presenting on 08/19/2021 for Follow-up (Urgent care follow up- 4/22 acute bronchitis. Patient is better except for his dry cough. ) ? ?Patient is accompanied by mom, who he is okay with being present. ? ?Patient was seen at urgent care a little over 2 weeks ago at which time he was diagnosed with acute bronchitis and treated with albuterol, prednisone, and a cough suppressant.  Mom reports all symptoms have improved except he has a lingering dry cough. ? ? ?ROS: Negative unless specifically indicated above in HPI.  ? ?Relevant past medical history reviewed and updated as indicated.  ? ?Allergies and medications reviewed and updated. ? ? ?Current Outpatient Medications:  ?  cetirizine (ZYRTEC) 10 MG tablet, Take 1 tablet by mouth once daily, Disp: 60 tablet, Rfl: 1 ?  albuterol (VENTOLIN HFA) 108 (90 Base) MCG/ACT inhaler, Inhale 1-2 puffs into the lungs every 6 (six) hours as needed for wheezing or shortness of breath. (Patient not taking: Reported on 08/19/2021), Disp: 18 g, Rfl: 0 ? ?No Known Allergies ? ?Objective:  ? ?BP 124/81   Pulse 94   Temp 98.1 ?F (36.7 ?C) (Temporal)   Ht 5\' 10"  (1.778 m)   Wt 191 lb 9.6 oz (86.9 kg)   SpO2 95%   BMI 27.49 kg/m?   ? ?Physical Exam ?Vitals reviewed.  ?Constitutional:   ?   General: He is not in acute distress. ?   Appearance: Normal appearance. He is not ill-appearing, toxic-appearing or  diaphoretic.  ?HENT:  ?   Head: Normocephalic and atraumatic.  ?   Mouth/Throat:  ?   Mouth: Mucous membranes are moist.  ?   Pharynx: Oropharynx is clear. No oropharyngeal exudate or posterior oropharyngeal erythema.  ?Eyes:  ?   General: No scleral icterus.    ?   Right eye: No discharge.     ?   Left eye: No discharge.  ?   Conjunctiva/sclera: Conjunctivae normal.  ?Cardiovascular:  ?   Rate and Rhythm: Normal rate and regular rhythm.  ?   Heart sounds: Normal heart sounds. No murmur heard. ?  No friction rub. No gallop.  ?Pulmonary:  ?   Effort: Pulmonary effort is normal. No respiratory distress.  ?   Breath sounds: Normal breath sounds. No stridor. No wheezing, rhonchi or rales.  ?Musculoskeletal:     ?   General: Normal range of motion.  ?   Cervical back: Normal range of motion.  ?Skin: ?   General: Skin is warm and dry.  ?Neurological:  ?   Mental Status: He is alert and oriented to person, place, and time. Mental status is at baseline.  ?Psychiatric:     ?   Mood and Affect: Mood normal.     ?   Behavior: Behavior normal.     ?   Thought Content: Thought content normal.     ?  Judgment: Judgment normal.  ? ? ? ? ? ? ?

## 2021-09-14 ENCOUNTER — Other Ambulatory Visit: Payer: Self-pay

## 2021-09-14 ENCOUNTER — Encounter (HOSPITAL_COMMUNITY): Payer: Self-pay

## 2021-09-14 ENCOUNTER — Emergency Department (HOSPITAL_COMMUNITY): Payer: Medicare Other

## 2021-09-14 ENCOUNTER — Emergency Department (HOSPITAL_COMMUNITY)
Admission: EM | Admit: 2021-09-14 | Discharge: 2021-09-14 | Disposition: A | Discharge status: Home / Self Care | Payer: Medicare Other | Attending: Emergency Medicine | Admitting: Emergency Medicine

## 2021-09-14 DIAGNOSIS — R630 Anorexia: Secondary | ICD-10-CM | POA: Insufficient documentation

## 2021-09-14 DIAGNOSIS — J029 Acute pharyngitis, unspecified: Secondary | ICD-10-CM | POA: Insufficient documentation

## 2021-09-14 DIAGNOSIS — Z20822 Contact with and (suspected) exposure to covid-19: Secondary | ICD-10-CM | POA: Insufficient documentation

## 2021-09-14 DIAGNOSIS — R000 Tachycardia, unspecified: Secondary | ICD-10-CM | POA: Diagnosis not present

## 2021-09-14 DIAGNOSIS — R052 Subacute cough: Secondary | ICD-10-CM | POA: Insufficient documentation

## 2021-09-14 DIAGNOSIS — R059 Cough, unspecified: Secondary | ICD-10-CM | POA: Diagnosis present

## 2021-09-14 HISTORY — DX: Autistic disorder: F84.0

## 2021-09-14 LAB — CBC WITH DIFFERENTIAL/PLATELET
Abs Immature Granulocytes: 0.03 10*3/uL (ref 0.00–0.07)
Basophils Absolute: 0.1 10*3/uL (ref 0.0–0.1)
Basophils Relative: 1 %
Eosinophils Absolute: 0.5 10*3/uL (ref 0.0–0.5)
Eosinophils Relative: 6 %
HCT: 39.4 % (ref 39.0–52.0)
Hemoglobin: 12.5 g/dL — ABNORMAL LOW (ref 13.0–17.0)
Immature Granulocytes: 0 %
Lymphocytes Relative: 25 %
Lymphs Abs: 1.9 10*3/uL (ref 0.7–4.0)
MCH: 24.7 pg — ABNORMAL LOW (ref 26.0–34.0)
MCHC: 31.7 g/dL (ref 30.0–36.0)
MCV: 77.7 fL — ABNORMAL LOW (ref 80.0–100.0)
Monocytes Absolute: 1.7 10*3/uL — ABNORMAL HIGH (ref 0.1–1.0)
Monocytes Relative: 22 %
Neutro Abs: 3.5 10*3/uL (ref 1.7–7.7)
Neutrophils Relative %: 46 %
Platelets: 438 10*3/uL — ABNORMAL HIGH (ref 150–400)
RBC: 5.07 MIL/uL (ref 4.22–5.81)
RDW: 14.6 % (ref 11.5–15.5)
WBC: 7.7 10*3/uL (ref 4.0–10.5)
nRBC: 0 % (ref 0.0–0.2)

## 2021-09-14 LAB — COMPREHENSIVE METABOLIC PANEL
ALT: 11 U/L (ref 0–44)
AST: 10 U/L — ABNORMAL LOW (ref 15–41)
Albumin: 3.2 g/dL — ABNORMAL LOW (ref 3.5–5.0)
Alkaline Phosphatase: 70 U/L (ref 38–126)
Anion gap: 9 (ref 5–15)
BUN: 7 mg/dL (ref 6–20)
CO2: 23 mmol/L (ref 22–32)
Calcium: 8.8 mg/dL — ABNORMAL LOW (ref 8.9–10.3)
Chloride: 105 mmol/L (ref 98–111)
Creatinine, Ser: 0.89 mg/dL (ref 0.61–1.24)
GFR, Estimated: >60 mL/min (ref >60)
Glucose, Bld: 109 mg/dL — ABNORMAL HIGH (ref 70–99)
Potassium: 3.7 mmol/L (ref 3.5–5.1)
Sodium: 137 mmol/L (ref 135–145)
Total Bilirubin: 0.5 mg/dL (ref 0.3–1.2)
Total Protein: 8 g/dL (ref 6.5–8.1)

## 2021-09-14 LAB — GROUP A STREP BY PCR: Group A Strep by PCR: NOT DETECTED

## 2021-09-14 LAB — LACTIC ACID, PLASMA: Lactic Acid, Venous: 1.2 mmol/L (ref 0.5–1.9)

## 2021-09-14 LAB — RESP PANEL BY RT-PCR (FLU A&B, COVID) ARPGX2
Influenza A by PCR: NEGATIVE
Influenza B by PCR: NEGATIVE
SARS Coronavirus 2 by RT PCR: NEGATIVE

## 2021-09-14 MED ORDER — ACETAMINOPHEN 325 MG PO TABS
650.0000 mg | ORAL_TABLET | Freq: Once | ORAL | Status: AC | PRN
Start: 2021-09-14 — End: 2021-09-14
  Administered 2021-09-14: 650 mg via ORAL
  Filled 2021-09-14: qty 2

## 2021-09-14 MED ORDER — IBUPROFEN 800 MG PO TABS
800.0000 mg | ORAL_TABLET | Freq: Once | ORAL | Status: AC
Start: 2021-09-14 — End: 2021-09-14
  Administered 2021-09-14: 800 mg via ORAL
  Filled 2021-09-14: qty 1

## 2021-09-14 NOTE — Discharge Instructions (Addendum)
Like we discussed, you can continue to give him over-the-counter medications as needed for management of his symptoms.  I would recommend Tylenol as well as Motrin for his fevers.  I would recommend rotating these 2 medications.  Please follow the instructions on the bottles.  Please follow-up with his primary care provider tomorrow regarding his symptoms as well as this visit today.  If he develops any new or worsening symptoms whatsoever please bring him back to the emergency department for reevaluation.

## 2021-09-14 NOTE — ED Triage Notes (Signed)
Pt presents with an occasional dry cough x 4 weeks. Pt has seen PCP and has been on 2 rounds of abx without relief. Denies ShOB.

## 2021-09-14 NOTE — ED Provider Notes (Signed)
[facility] EMERGENCY DEPARTMENT Provider Note   CSN: [id number] Arrival date & time: 09/14/21  1723     History  Chief Complaint  Patient presents with   Cough    Ason Castrogiovanni is a 41 y.o. male.  HPI Patient is a 41 year old male with a history of autism who presents to the emergency department with his mother due to a cough.  His mother states that his symptoms been ongoing for about 1 month.  States his cough is dry.  Reports sore throat as well as decreased p.o. intake.  No nausea, vomiting, diarrhea, chest pain, shortness of breath.  She states that he was seen at urgent care as well as by his PCP and has completed 2 rounds of antibiotics with little improvement.    Home Medications Prior to Admission medications   Medication Sig Start Date End Date Taking? Authorizing Provider  albuterol (VENTOLIN HFA) 108 (90 Base) MCG/ACT inhaler Inhale 1-2 puffs into the lungs every 6 (six) hours as needed for wheezing or shortness of breath. Patient not taking: Reported on 08/19/2021 08/02/21   Volney American, PA-C  benzonatate (TESSALON PERLES) 100 MG capsule Take 2 capsules (200 mg total) by mouth 3 (three) times daily as needed for cough. 08/19/21   Loman Brooklyn, FNP  cetirizine (ZYRTEC) 10 MG tablet Take 1 tablet by mouth once daily 05/08/19   Sharion Balloon, FNP      Allergies    Patient has no known allergies.    Review of Systems   Review of Systems  All other systems reviewed and are negative. Ten systems reviewed and are negative for acute change, except as noted in the HPI.   Physical Exam Updated Vital Signs BP 133/88 (BP Location: Right Arm)   Pulse (!) 101   Temp 99.3 F (37.4 C) (Oral)   Resp 17   Ht 5\' 10"  (1.778 m)   Wt 86.2 kg   SpO2 99%   BMI 27.26 kg/m  Physical Exam Vitals and nursing note reviewed.  Constitutional:      General: He is not in acute distress.    Appearance: Normal appearance. He is normal weight. He is not ill-appearing,  toxic-appearing or diaphoretic.  HENT:     Head: Normocephalic and atraumatic.     Right Ear: External ear normal.     Left Ear: External ear normal.     Nose: Nose normal.     Mouth/Throat:     Mouth: Mucous membranes are moist.     Pharynx: Oropharynx is clear. Posterior oropharyngeal erythema present. No oropharyngeal exudate.     Comments: Uvula midline.  Readily no secretions.  No hot potato voice.  No tonsillar hypertrophy.  Erythema noted in the posterior oropharynx.  No exudates. Eyes:     Extraocular Movements: Extraocular movements intact.  Cardiovascular:     Rate and Rhythm: Regular rhythm. Tachycardia present.     Pulses: Normal pulses.     Heart sounds: Normal heart sounds. No murmur heard.   No friction rub. No gallop.  Pulmonary:     Effort: Pulmonary effort is normal. No respiratory distress.     Breath sounds: Normal breath sounds. No stridor. No wheezing, rhonchi or rales.     Comments: LCTAB without wheezing, rales, or rhonchi. Abdominal:     General: Abdomen is flat.     Palpations: Abdomen is soft.     Tenderness: There is no abdominal tenderness.     Comments: Abdomen is flat,  soft, and nontender.  Musculoskeletal:        General: Normal range of motion.     Cervical back: Normal range of motion and neck supple. No tenderness.  Skin:    General: Skin is warm and dry.  Neurological:     General: No focal deficit present.     Mental Status: He is alert.     Comments: Answering questions and following commands.  Moving all 4 extremities.  No gross deficits.  Psychiatric:        Mood and Affect: Mood normal.        Behavior: Behavior normal.   ED Results / Procedures / Treatments   Labs (all labs ordered are listed, but only abnormal results are displayed) Labs Reviewed  COMPREHENSIVE METABOLIC PANEL - Abnormal; Notable for the following components:      Result Value   Glucose, Bld 109 (*)    Calcium 8.8 (*)    Albumin 3.2 (*)    AST 10 (*)    All  other components within normal limits  CBC WITH DIFFERENTIAL/PLATELET - Abnormal; Notable for the following components:   Hemoglobin 12.5 (*)    MCV 77.7 (*)    MCH 24.7 (*)    Platelets 438 (*)    Monocytes Absolute 1.7 (*)    All other components within normal limits  GROUP A STREP BY PCR  RESP PANEL BY RT-PCR (FLU A&B, COVID) [id number]  LACTIC ACID, PLASMA   EKG None  Radiology DG Chest 2 View  Result Date: 09/14/2021 CLINICAL DATA:  Cough, fever EXAM: CHEST - 2 VIEW COMPARISON:  None Available. FINDINGS: Cardiac size is within normal limits. There are no signs of pulmonary edema. Peribronchial thickening suggests bronchitis. There is no focal pulmonary consolidation. Crowding of markings seen in the posterior lower lung fields in the lateral view may be due to poor inspiration or suggest subsegmental atelectasis. There is no pleural effusion or pneumothorax. Blebs are seen in both apices. IMPRESSION: There are no signs of pulmonary edema or focal pulmonary consolidation. There is no pleural effusion. In the lateral view, increased markings are seen in the posterior lower lung fields which may be due to poor inspiration or suggest subsegmental atelectasis. Electronically Signed   By: Elmer Picker M.D.   On: 09/14/2021 18:40    Procedures Procedures   Medications Ordered in ED Medications  acetaminophen (TYLENOL) tablet 650 mg (650 mg Oral Given 09/14/21 1806)  ibuprofen (ADVIL) tablet 800 mg (800 mg Oral Given 09/14/21 1932)   ED Course/ Medical Decision Making/ A&P                           Medical Decision Making Amount and/or Complexity of Data Reviewed Labs: ordered. Radiology: ordered.  Risk OTC drugs. Prescription drug management.   Pt is a 41 y.o. male with history of autism who presents to the emergency department with his mother due to a dry cough, sore throat, as well as decreased p.o. intake for the past 4 weeks.  States he been seen multiple times for this and  completed 2 rounds of antibiotics with little improvement.  Labs: CBC with a hemoglobin of 12.5, MCV of 77.7, MCH of 24.7, platelets of 438, monocytes of 1.7. CMP with a glucose of 109, calcium of 8.8, albumin of 3.2, AST of 10. Lactic acid 1.2. Respiratory panel is negative. Group A strep by PCR is not detected.  Imaging: Chest x-ray shows no  signs of pulmonary edema or focal pulmonary consolidation.  There is no pleural effusion.  In the lateral view with a noted increased markings seen in the posterior lower lung fields which may be due to poor inspiration or suggest subsegmental atelectasis.  I, Rayna Sexton, PA-C, personally reviewed and evaluated these images and lab results as part of my medical decision-making.  Unsure the source of the patient's symptoms.  Appears consistent with a likely viral illness.  On my exam patient is mildly tachycardic without murmurs, rubs, or gallops.  Lungs are clear to auscultation bilaterally.  Abdomen is soft and nontender.  No nuchal rigidity noted.  Patient was found to be febrile with a temperature of 100.9 F and tachycardic at 111 bpm when he first arrived.  He was given a dose of Tylenol in triage and I also ordered an additional dose of ibuprofen.  Fever resolved with a temperature of 99.3 F.  He had erythema in the posterior oropharynx but no exudates.  Uvula midline with no tonsillar hypertrophy.  Readily no secretions.  Not potato voice.  Soft submental compartments.  Does not appear consistent with PTA or Ludwig's angina at this time.  Strep test is negative.  Chest x-ray shows possible subsegmental atelectasis but is otherwise reassuring.  No focal pulmonary consolidations concerning for PNA.  His mother at bedside states that he has been experiencing a cough has been waxing and waning for the past month and is also complaining of sore throat and decreased p.o. intake but otherwise is having no somatic complaints.  States that the fevers are a  new symptom.  Denies any nausea, vomiting, diarrhea.  Lactic acid within normal limits at 1.2.  Nontoxic-appearing.  Patient appears stable for discharge at this time and his mother is agreeable.  Recommended continued use of Tylenol as well as Motrin for management of his sore throat and fevers.  Discussed OTC antitussives.  Recommended that she follow-up with his PCP tomorrow to schedule an appointment for reevaluation.  We discussed return precautions.  Her questions were answered and she was amicable at the time of discharge.  Note: Portions of this report may have been transcribed using voice recognition software. Every effort was made to ensure accuracy; however, inadvertent computerized transcription errors may be present.   Final Clinical Impression(s) / ED Diagnoses Final diagnoses:  Subacute cough   Rx / DC Orders ED Discharge Orders     None         Rayna Sexton, PA-C 09/14/21 2217    Lajean Saver, MD 09/30/21 1104

## 2021-09-14 NOTE — ED Notes (Signed)
Patient transported to X-ray 

## 2021-09-14 NOTE — ED Notes (Signed)
Lab at bedside

## 2021-09-29 ENCOUNTER — Ambulatory Visit: Payer: Medicare Other | Admitting: Nurse Practitioner

## 2021-09-30 ENCOUNTER — Ambulatory Visit (INDEPENDENT_AMBULATORY_CARE_PROVIDER_SITE_OTHER): Payer: Medicare Other | Admitting: Nurse Practitioner

## 2021-09-30 ENCOUNTER — Encounter: Payer: Self-pay | Admitting: Nurse Practitioner

## 2021-09-30 VITALS — BP 121/83 | HR 109 | Ht 67.0 in | Wt 181.0 lb

## 2021-09-30 DIAGNOSIS — J029 Acute pharyngitis, unspecified: Secondary | ICD-10-CM

## 2021-09-30 LAB — CULTURE, GROUP A STREP

## 2021-09-30 LAB — RAPID STREP SCREEN (MED CTR MEBANE ONLY): Strep Gp A Ag, IA W/Reflex: NEGATIVE

## 2021-09-30 MED ORDER — AMOXICILLIN-POT CLAVULANATE 875-125 MG PO TABS: 1.0000 | ORAL_TABLET | Freq: Two times a day (BID) | ORAL | 0 refills | Status: DC

## 2021-09-30 NOTE — Progress Notes (Signed)
Acute Office Visit  Subjective:     Patient ID: Donald Gallagher, male    DOB: March 21, 1981, 41 y.o.   MRN: [id number]  Chief Complaint  Patient presents with   Sore Throat    States it hurts to swallow anything - not able to eat anything - been going on for about 2 months     Sore Throat  This is a recurrent problem. The current episode started 1 to 4 weeks ago. The problem has been gradually worsening. Neither side of throat is experiencing more pain than the other. The maximum temperature recorded prior to his arrival was 101 - 101.9 F. The pain is at a severity of 6/10. The pain is moderate. Associated symptoms include ear pain. Pertinent negatives include no congestion or coughing. He has had no exposure to strep. He has tried acetaminophen for the symptoms.     Review of Systems  Constitutional:  Positive for fever and weight loss. Negative for chills.  HENT:  Positive for ear pain and sore throat. Negative for congestion.   Eyes: Negative.   Respiratory:  Negative for cough.   Cardiovascular: Negative.   Skin: Negative.  Negative for rash.  All other systems reviewed and are negative.       Objective:    BP 121/83   Pulse (!) 109   Ht 5\' 7"  (1.702 m)   Wt 181 lb (82.1 kg)   SpO2 99%   BMI 28.35 kg/m  BP Readings from Last 3 Encounters:  09/30/21 121/83  09/14/21 (!) 137/95  08/19/21 124/81   Wt Readings from Last 3 Encounters:  09/30/21 181 lb (82.1 kg)  09/14/21 190 lb (86.2 kg)  08/19/21 191 lb 9.6 oz (86.9 kg)      Physical Exam Vitals and nursing note reviewed.  Constitutional:      Appearance: He is well-developed.  HENT:     Head: Normocephalic.     Right Ear: Ear canal normal. Tympanic membrane is erythematous.     Left Ear: Ear canal normal. Tympanic membrane is not erythematous.     Nose: No congestion.     Mouth/Throat:     Pharynx: Uvula midline. Posterior oropharyngeal erythema present.  Eyes:     Conjunctiva/sclera: Conjunctivae normal.   Cardiovascular:     Rate and Rhythm: Normal rate and regular rhythm.     Pulses: Normal pulses.     Heart sounds: Normal heart sounds.  Pulmonary:     Effort: Pulmonary effort is normal.     Breath sounds: Normal breath sounds.  Abdominal:     General: Bowel sounds are normal.  Skin:    General: Skin is warm.     Findings: No rash.  Neurological:     General: No focal deficit present.     Mental Status: He is alert and oriented to person, place, and time.  Psychiatric:        Behavior: Behavior normal.     No results found for any visits on 09/30/21.      Assessment & Plan:  Patient presents with pharyngitis  Take meds as prescribed - Use warm sal water gargle -Use saline nose sprays frequently -Force fluids -For fever or aches or pains- take Tylenol or ibuprofen. -Augmentin 875-125 mg tablet by mouth  -If symptoms do not improve, he may need to be COVID tested to rule this out Follow up with worsening unresolved symptoms  Problem List Items Addressed This Visit   None Visit Diagnoses  Pharyngitis, unspecified etiology    -  Primary   Relevant Medications   amoxicillin-clavulanate (AUGMENTIN) 875-125 MG tablet   Other Relevant Orders   Rapid Strep Screen ([facility] ONLY)       Meds ordered this encounter  Medications   DISCONTD: amoxicillin-clavulanate (AUGMENTIN) 875-125 MG tablet    Sig: Take 1 tablet by mouth 2 (two) times daily.    Dispense:  14 tablet    Refill:  0    Order Specific Question:   Supervising Provider    Answer:   Mechele Claude [[id number]]   amoxicillin-clavulanate (AUGMENTIN) 875-125 MG tablet    Sig: Take 1 tablet by mouth 2 (two) times daily.    Dispense:  20 tablet    Refill:  0    Order Specific Question:   Supervising Provider    Answer:   Mechele Claude [[id number]]    Return if symptoms worsen or fail to improve.  Daryll Drown, NP

## 2021-09-30 NOTE — Patient Instructions (Signed)
Sore Throat When you have a sore throat, your throat may feel: Tender. Burning. Irritated. Scratchy. Painful when you swallow. Painful when you talk. Many things can cause a sore throat, such as: An infection. Allergies. Dry air. Smoke or pollution. Radiation treatment for cancer. Gastroesophageal reflux disease (GERD). A tumor. A sore throat can be the first sign of another sickness. It can happen with other problems, like: Coughing. Sneezing. Fever. Swelling of the glands in the neck. Most sore throats go away without treatment. Follow these instructions at home:     Medicines Take over-the-counter and prescription medicines only as told by your doctor. Children often get sore throats. Do not give your child aspirin. Use throat sprays to soothe your throat as told by your health care provider. Managing pain To help with pain: Sip warm liquids, such as broth, herbal tea, or warm water. Eat or drink cold or frozen liquids, such as frozen ice pops. Rinse your mouth (gargle) with a salt water mixture 3-4 times a day or as needed. To make salt water, dissolve -1 tsp (3-6 g) of salt in 1 cup (237 mL) of warm water. Do not swallow this mixture. Suck on hard candy or throat lozenges. Put a cool-mist humidifier in your bedroom at night. Sit in the bathroom with the door closed for 5-10 minutes while you run hot water in the shower. General instructions Do not smoke or use any products that contain nicotine or tobacco. If you need help quitting, ask your doctor. Get plenty of rest. Drink enough fluid to keep your pee (urine) pale yellow. Wash your hands often for at least 20 seconds with soap and water. If soap and water are not available, use hand sanitizer. Contact a doctor if: You have a fever for more than 2-3 days. You keep having symptoms for more than 2-3 days. Your throat does not get better in 7 days. You have a fever and your symptoms suddenly get worse. Your  child who is 3 months to 3 years old has a temperature of 102.2F (39C) or higher. Get help right away if: You have trouble breathing. You cannot swallow fluids, soft foods, or your spit. You have swelling in your throat or neck that gets worse. You feel like you may vomit (nauseous) and this feeling lasts a long time. You cannot stop vomiting. These symptoms may be an emergency. Get help right away. Call your local emergency services (911 in the [place] Do not wait to see if the symptoms will go away. Do not drive yourself to the hospital. Summary A sore throat is a painful, burning, irritated, or scratchy throat. Many things can cause a sore throat. Take over-the-counter medicines only as told by your doctor. Get plenty of rest. Drink enough fluid to keep your pee (urine) pale yellow. Contact a doctor if your symptoms get worse or your sore throat does not get better within 7 days. This information is not intended to replace advice given to you by your health care provider. Make sure you discuss any questions you have with your health care provider. Document Revised: 06/26/2020 Document Reviewed: 06/26/2020 Elsevier Patient Education  2023 Elsevier Inc.  

## 2021-10-06 ENCOUNTER — Telehealth: Payer: Self-pay | Admitting: Nurse Practitioner

## 2021-10-06 ENCOUNTER — Other Ambulatory Visit: Payer: Self-pay | Admitting: Nurse Practitioner

## 2021-10-06 DIAGNOSIS — J029 Acute pharyngitis, unspecified: Secondary | ICD-10-CM

## 2021-10-07 ENCOUNTER — Other Ambulatory Visit: Payer: Self-pay

## 2021-10-07 ENCOUNTER — Emergency Department (HOSPITAL_COMMUNITY): Payer: Medicare Other

## 2021-10-07 ENCOUNTER — Encounter (HOSPITAL_COMMUNITY): Payer: Self-pay | Admitting: *Deleted

## 2021-10-07 ENCOUNTER — Emergency Department (HOSPITAL_COMMUNITY)
Admission: EM | Admit: 2021-10-07 | Discharge: 2021-10-07 | Disposition: A | Discharge status: Home / Self Care | Payer: Medicare Other | Attending: Emergency Medicine | Admitting: Emergency Medicine

## 2021-10-07 DIAGNOSIS — J029 Acute pharyngitis, unspecified: Secondary | ICD-10-CM | POA: Diagnosis not present

## 2021-10-07 DIAGNOSIS — F840 Autistic disorder: Secondary | ICD-10-CM | POA: Diagnosis not present

## 2021-10-07 DIAGNOSIS — R109 Unspecified abdominal pain: Secondary | ICD-10-CM | POA: Diagnosis present

## 2021-10-07 DIAGNOSIS — R1310 Dysphagia, unspecified: Secondary | ICD-10-CM | POA: Diagnosis not present

## 2021-10-07 DIAGNOSIS — R059 Cough, unspecified: Secondary | ICD-10-CM | POA: Insufficient documentation

## 2021-10-07 DIAGNOSIS — R1084 Generalized abdominal pain: Secondary | ICD-10-CM | POA: Insufficient documentation

## 2021-10-07 LAB — CBC
HCT: 39.2 % (ref 39.0–52.0)
Hemoglobin: 12.5 g/dL — ABNORMAL LOW (ref 13.0–17.0)
MCH: 24.2 pg — ABNORMAL LOW (ref 26.0–34.0)
MCHC: 31.9 g/dL (ref 30.0–36.0)
MCV: 75.8 fL — ABNORMAL LOW (ref 80.0–100.0)
Platelets: 463 10*3/uL — ABNORMAL HIGH (ref 150–400)
RBC: 5.17 MIL/uL (ref 4.22–5.81)
RDW: 14.8 % (ref 11.5–15.5)
WBC: 7.5 10*3/uL (ref 4.0–10.5)
nRBC: 0 % (ref 0.0–0.2)

## 2021-10-07 LAB — COMPREHENSIVE METABOLIC PANEL
ALT: 12 U/L (ref 0–44)
AST: 11 U/L — ABNORMAL LOW (ref 15–41)
Albumin: 3 g/dL — ABNORMAL LOW (ref 3.5–5.0)
Alkaline Phosphatase: 67 U/L (ref 38–126)
Anion gap: 13 (ref 5–15)
BUN: 7 mg/dL (ref 6–20)
CO2: 24 mmol/L (ref 22–32)
Calcium: 9 mg/dL (ref 8.9–10.3)
Chloride: 99 mmol/L (ref 98–111)
Creatinine, Ser: 0.9 mg/dL (ref 0.61–1.24)
GFR, Estimated: >60 mL/min (ref >60)
Glucose, Bld: 117 mg/dL — ABNORMAL HIGH (ref 70–99)
Potassium: 3.8 mmol/L (ref 3.5–5.1)
Sodium: 136 mmol/L (ref 135–145)
Total Bilirubin: 0.6 mg/dL (ref 0.3–1.2)
Total Protein: 8.3 g/dL — ABNORMAL HIGH (ref 6.5–8.1)

## 2021-10-07 LAB — URINALYSIS, ROUTINE W REFLEX MICROSCOPIC
Bacteria, UA: NONE SEEN
Bilirubin Urine: NEGATIVE
Glucose, UA: NEGATIVE mg/dL
Ketones, ur: 5 mg/dL — AB
Leukocytes,Ua: NEGATIVE
Nitrite: NEGATIVE
Protein, ur: 30 mg/dL — AB
Specific Gravity, Urine: >1.046 — ABNORMAL HIGH (ref 1.005–1.030)
pH: 6 (ref 5.0–8.0)

## 2021-10-07 LAB — LIPASE, BLOOD: Lipase: 23 U/L (ref 11–51)

## 2021-10-07 LAB — TYPE AND SCREEN
ABO/RH(D): O POS
Antibody Screen: NEGATIVE

## 2021-10-07 LAB — ABO/RH: ABO/RH(D): O POS

## 2021-10-07 MED ORDER — IOHEXOL 300 MG/ML  SOLN
100.0000 mL | Freq: Once | INTRAMUSCULAR | Status: AC | PRN
  Administered 2021-10-07: 100 mL via INTRAVENOUS

## 2021-10-07 MED ORDER — SODIUM CHLORIDE 0.9 % IV BOLUS
500.0000 mL | Freq: Once | INTRAVENOUS | Status: AC
  Administered 2021-10-07: 500 mL via INTRAVENOUS

## 2021-10-07 MED ORDER — PANTOPRAZOLE SODIUM 40 MG IV SOLR
40.0000 mg | Freq: Once | INTRAVENOUS | Status: AC
Start: 2021-10-07 — End: 2021-10-07
  Administered 2021-10-07: 40 mg via INTRAVENOUS
  Filled 2021-10-07: qty 10

## 2021-10-07 MED ORDER — PANTOPRAZOLE SODIUM 40 MG PO TBEC: 40.0000 mg | DELAYED_RELEASE_TABLET | Freq: Every day | ORAL | 0 refills | Status: AC

## 2021-10-07 NOTE — ED Provider Notes (Signed)
[facility] EMERGENCY DEPARTMENT Provider Note   CSN: [id number] Arrival date & time: 10/07/21  4098     History  Chief Complaint  Patient presents with   Abdominal Pain    Donald Gallagher is a 41 y.o. male.  The history is provided by the patient and a relative. The history is limited by a developmental delay.  Abdominal Pain Associated symptoms: cough and sore throat   Associated symptoms: no chills and no fever        Donald Gallagher is a 41 y.o. male past medical history for autistic disorder who presents to the Emergency Department companied by his mother.  History from patient is limited due to autism, he is able to give yes and no answers only  Mother is requesting evaluation for difficulty swallowing throat pain and abdominal pain.  Symptoms have been present for 5 to 6 weeks.  Mother states that he has been evaluated by his PCP, urgent care and ER for throat pain.  He is currently on Augmentin.  He has had 2 negative strep test recently.  She states that she is concerned that he is losing weight because he is not eating properly.  He is tolerating fluids without difficulty.  She states that he will not eat solid foods and grimaces when he attempts to swallow.  She denies any vomiting or diarrhea.  He is complaining of throat and abdominal pain.  This morning, mother states that she entered his bedroom and noticed a "pool of blood" beside his bed.  She did not see any evidence of blood on his clothing or face.  No injuries of his torso or extremities.  She is unsure where the blood originated from.  She also endorses recent cough that was evaluated by his PCP.  Cough has since improved.    Home Medications Prior to Admission medications   Medication Sig Start Date End Date Taking? Authorizing Provider  amoxicillin-clavulanate (AUGMENTIN) 875-125 MG tablet Take 1 tablet by mouth 2 (two) times daily. 09/30/21  Yes Daryll Drown, NP      Allergies    Patient has no known allergies.     Review of Systems   Review of Systems  Unable to perform ROS: Other  Constitutional:  Negative for chills and fever.  HENT:  Positive for sore throat and trouble swallowing.   Respiratory:  Positive for cough.   Gastrointestinal:  Positive for abdominal pain.    Physical Exam Updated Vital Signs BP 119/79   Pulse 100   Temp 98.3 F (36.8 C) (Oral)   Resp (!) 22   Ht 5\' 7"  (1.702 m)   Wt 81.6 kg   SpO2 100%   BMI 28.19 kg/m  Physical Exam Vitals and nursing note reviewed.  Constitutional:      General: He is not in acute distress.    Appearance: He is well-developed. He is not ill-appearing or toxic-appearing.  HENT:     Mouth/Throat:     Mouth: Mucous membranes are moist.     Pharynx: Oropharynx is clear. No oropharyngeal exudate.  Cardiovascular:     Rate and Rhythm: Normal rate and regular rhythm.     Heart sounds: No murmur heard. Pulmonary:     Effort: Pulmonary effort is normal. No respiratory distress.  Chest:     Chest wall: No tenderness.  Abdominal:     General: There is no distension.     Palpations: Abdomen is soft.     Tenderness: There is abdominal  tenderness. There is no right CVA tenderness, left CVA tenderness or guarding.  Neurological:     Mental Status: He is alert.    ED Results / Procedures / Treatments   Labs (all labs ordered are listed, but only abnormal results are displayed) Labs Reviewed  COMPREHENSIVE METABOLIC PANEL - Abnormal; Notable for the following components:      Result Value   Glucose, Bld 117 (*)    Total Protein 8.3 (*)    Albumin 3.0 (*)    AST 11 (*)    All other components within normal limits  CBC - Abnormal; Notable for the following components:   Hemoglobin 12.5 (*)    MCV 75.8 (*)    MCH 24.2 (*)    Platelets 463 (*)    All other components within normal limits  LIPASE, BLOOD  URINALYSIS, ROUTINE W REFLEX MICROSCOPIC  POC OCCULT BLOOD, ED  TYPE AND SCREEN  ABO/RH    EKG None  Radiology CT  ABDOMEN PELVIS W CONTRAST  Result Date: 10/07/2021 CLINICAL DATA:  Abdominal pain, acute, nonlocalized EXAM: CT ABDOMEN AND PELVIS WITH CONTRAST TECHNIQUE: Multidetector CT imaging of the abdomen and pelvis was performed using the standard protocol following bolus administration of intravenous contrast. RADIATION DOSE REDUCTION: This exam was performed according to the departmental dose-optimization program which includes automated exposure control, adjustment of the mA and/or kV according to patient size and/or use of iterative reconstruction technique. CONTRAST:  OMNIPAQUE IOHEXOL 300 MG/ML  SOLN COMPARISON:  None Available. FINDINGS: Lower chest: No acute abnormality. Hepatobiliary: No focal liver abnormality is seen. The gallbladder is unremarkable. Pancreas: Unremarkable. No pancreatic ductal dilatation or surrounding inflammatory changes. Spleen: Normal in size without focal abnormality. Adrenals/Urinary Tract: Adrenal glands are unremarkable. No hydronephrosis or nephrolithiasis. The bladder is moderately distended. Stomach/Bowel: The stomach is within normal limits. There is no evidence of bowel obstruction.The appendix is normal. Vascular/Lymphatic: No significant vascular findings are present. No enlarged abdominal or pelvic lymph nodes. Reproductive: Mildly enlarged prostate gland. Other: No abdominal wall hernia or abnormality. No abdominopelvic ascites. Musculoskeletal: No acute or significant osseous findings. IMPRESSION: No acute abdominopelvic abnormality.  Normal appendix. Electronically Signed   By: Caprice Renshaw M.D.   On: 10/07/2021 14:41    Procedures Procedures    Medications Ordered in ED Medications  sodium chloride 0.9 % bolus 500 mL (0 mLs Intravenous Stopped 10/07/21 1547)  pantoprazole (PROTONIX) injection 40 mg (40 mg Intravenous Given 10/07/21 1354)  iohexol (OMNIPAQUE) 300 MG/ML solution 100 mL (100 mLs Intravenous Contrast Given 10/07/21 1413)    ED Course/ Medical  Decision Making/ A&P                           Medical Decision Making Patient brought in by his mother.  Patient has history of autism history from patient limited to yes and no answers only.  He points to his throat and abdomen as sources of pain.  Mother concerned that he is not eating enough solid food and states that he appears to be losing weight.  She denies any vomiting diarrhea fever or chills.  No dysuria.  He has been seen here and PCP, has been treated for pharyngitis, recently completed course of Augmentin.  On exam, patient is cooperative nontoxic-appearing.  He will answer my questions but only as yes or no.  His mucous membranes are moist.  His abdomen is soft without guarding.  He is unable to localize his  abdominal pain.  Patient here with symptoms of abdominal pain and dysphagia.  Source of his pain is unclear if this is from his abdomen as in possible GERD or esophagitis or if this is localized to his throat.  Mother denies any witnessed vomiting, but states that on the morning of arrival she saw "a pool of blood" beside his bed.  On exam I do not appreciate any areas of injury, blood in the oropharynx or nares.  Will obtain labs and CT abdomen pelvis for further evaluation  Amount and/or Complexity of Data Reviewed External Data Reviewed: notes.    Details: Prior medical records reviewed by me including notes from recent ER visit.  Patient has been treated with Augmentin for pharyngitis.  He has had 2 negative strep test recently Labs: ordered.    Details: Labs here interpreted by me, no evidence ofLeukocytosis.  Hemoglobin 12.5 lipase unremarkable, chemistries without significant derangement.  Urinalysis shows small amount of blood without presence of infection. Radiology: ordered.    Details: CT abdomen and pelvis ordered results show no acute findings of the abdomen or pelvis.  Normal-appearing appendix. Discussion of management or test interpretation with external  provider(s): Source of patient's symptoms unclear at this time.  History limited to the mother as patient unable to give detailed history due to his autism. On my initial exam, patient pointed to his throat and abdomen as sources of pain.  On recheck, he is denying any abdominal pain.  His abdomen is soft on exam.  On review of medical records, it is noted that PCP is arranging ENT follow-up.  I feel this is appropriate, I will also recommend close outpatient follow-up with GI if ENT evaluation is equivocal.  I will also start patient on PPI.  Doubt acute process.  He appears appropriate for discharge home.  All questions were answered return precautions were discussed  Risk Prescription drug management.           Final Clinical Impression(s) / ED Diagnoses Final diagnoses:  Dysphagia, unspecified type  Generalized abdominal pain    Rx / DC Orders ED Discharge Orders     None         Pauline Aus, PA-C 10/10/21 1504    Eber Hong, MD 10/16/21 1718

## 2021-10-15 ENCOUNTER — Other Ambulatory Visit (INDEPENDENT_AMBULATORY_CARE_PROVIDER_SITE_OTHER): Payer: Self-pay

## 2021-10-15 ENCOUNTER — Encounter (INDEPENDENT_AMBULATORY_CARE_PROVIDER_SITE_OTHER): Payer: Self-pay | Admitting: Gastroenterology

## 2021-10-15 ENCOUNTER — Encounter (INDEPENDENT_AMBULATORY_CARE_PROVIDER_SITE_OTHER): Payer: Self-pay

## 2021-10-15 ENCOUNTER — Ambulatory Visit (INDEPENDENT_AMBULATORY_CARE_PROVIDER_SITE_OTHER): Payer: Medicare Other | Admitting: Gastroenterology

## 2021-10-15 DIAGNOSIS — R1310 Dysphagia, unspecified: Secondary | ICD-10-CM | POA: Diagnosis not present

## 2021-10-15 DIAGNOSIS — R195 Other fecal abnormalities: Secondary | ICD-10-CM

## 2021-10-15 MED ORDER — NYSTATIN 100000 UNIT/ML MT SUSP: 5.0000 mL | Freq: Four times a day (QID) | OROMUCOSAL | 1 refills | Status: AC

## 2021-10-15 NOTE — Progress Notes (Signed)
[name], M.D. Gastroenterology & Hepatology [facility] [address] [place], [place] [address] Primary Care Physician: Junie Spencer, FNP [address] [place] [address]  Referring MD: PCP  Chief Complaint: Odynophagia and diarrhea  History of Present Illness: Donald Gallagher is a 41 y.o. male with PMH autism, who presents for evaluation of odynophagia and diarrhea.  Mother provides most of the history. Mother reports that she noticed he was "grimacing" when swallowing food and he felt uncomfortable when eating. She reports that I seems he has been clearing up his throat more than in the past, with some coughing episodes. She states he has been able to swallow but it seems swallowing is causing significant discomfort. He is currently eating mostly chicken noodle soup or broth. He did not have any of these symptoms in the past. Mom states she saw once a pool of blood in his room, she thinks he vomited some blood a that time. He has also complained of abdominal pain in his mid abdomen.  The patient came to the ER on 10/07/2021 after presenting odynophagia for 6 weeks.  He was previously given Augmentin for possible odynophagia but he had negative strep testing x2 in the past.  In the ED, he underwent blood work-up that showed normal CMP, CBC showed mildly decreased hemoglobin of 12.5 and MCV of 75 with normal platelets of 463 and white blood cell count of 7.5.He underwent a CT of the abdomen and pelvis with IV contrast that was within normal limits.  He was discharged on pantoprazole 40 mg every day.  He is having 2 soft bowel movement per day recently but no liquid bowel movements. Mother believes this started after he was given antibiotics.  The patient denies having any nausea, vomiting, fever, chills, hematochezia, melena, hematemesis, abdominal distention, abdominal pain, diarrhea, jaundice, pruritus. Has lost close 35 lb  since his symptoms started.  Last UKG:URKYH Last Colonoscopy:never  FHx: neg for any gastrointestinal/liver disease, no malignancies Social: neg smoking, alcohol or illicit drug use Surgical: no abdominal surgeries  Past Medical History: Past Medical History:  Diagnosis Date   Autism     Past Surgical History:History reviewed. No pertinent surgical history.  Family History: Family History  Problem Relation Age of Onset   Diabetes Father     Social History: Social History   Tobacco Use  Smoking Status Never  Smokeless Tobacco Never   Social History   Substance and Sexual Activity  Alcohol Use No   Alcohol/week: 0.0 standard drinks of alcohol   Social History   Substance and Sexual Activity  Drug Use No    Allergies: No Known Allergies  Medications: Current Outpatient Medications  Medication Sig Dispense Refill   amoxicillin-clavulanate (AUGMENTIN) 875-125 MG tablet Take 1 tablet by mouth 2 (two) times daily. 20 tablet 0   pantoprazole (PROTONIX) 40 MG tablet Take 1 tablet (40 mg total) by mouth daily. 30 tablet 0   No current facility-administered medications for this visit.    Review of Systems: GENERAL: negative for malaise, night sweats HEENT: No changes in hearing or vision, no nose bleeds or other nasal problems. NECK: Negative for lumps, goiter, pain and significant neck swelling RESPIRATORY: Negative for cough, wheezing CARDIOVASCULAR: Negative for chest pain, leg swelling, palpitations, orthopnea GI: SEE HPI MUSCULOSKELETAL: Negative for joint pain or swelling, back pain, and muscle pain. SKIN: Negative for lesions, rash PSYCH: Negative for sleep disturbance, mood disorder and recent psychosocial stressors. HEMATOLOGY Negative  for prolonged bleeding, bruising easily, and swollen nodes. ENDOCRINE: Negative for cold or heat intolerance, polyuria, polydipsia and goiter. NEURO: negative for tremor, gait imbalance, syncope and seizures. The  remainder of the review of systems is noncontributory.   Physical Exam: BP 126/88 (BP Location: Left Arm, Patient Position: Sitting, Cuff Size: Small)   Pulse (!) 105   Ht 5\' 6"  (1.676 m)   Wt 169 lb 12.8 oz (77 kg)   BMI 27.41 kg/m  GENERAL: The patient is AO x3, in no acute distress. HEENT: Head is normocephalic and atraumatic. EOMI are intact. Mouth is well hydrated and without lesions. NECK: Supple. No masses LUNGS: Clear to auscultation. No presence of rhonchi/wheezing/rales. Adequate chest expansion HEART: RRR, normal s1 and s2. ABDOMEN: Soft, nontender, no guarding, no peritoneal signs, and nondistended. BS +. No masses. EXTREMITIES: Without any cyanosis, clubbing, rash, lesions or edema. NEUROLOGIC: AOx3, no focal motor deficit. SKIN: no jaundice, no rashes   Imaging/Labs: as above  I personally reviewed and interpreted the available labs, imaging and endoscopic files.  Impression and Plan: Donald Gallagher is a 41 y.o. male with PMH autism, who presents for evaluation of odynophagia and diarrhea.  Patient has presented significant odynophagia that has limited his oral intake and has led to significant weight loss in the last 6 weeks.  He has not presented any dysphagia but given the severity of his symptoms of odynophagia that have not improved with the use of oral antibiotics, we will need to evaluate this further with an EGD to rule out etiologies such as ulcerations or candidiasis.  He can continue taking his pantoprazole 40 mg every day but I will prescribe Magic mouthwash for now.  His new onset of soft stools is likely related to the recent start of antibiotics for which he will benefit from starting probiotics on a daily basis.  - Schedule EGD - Take magic mouthwash ever 6 hours as needed for pain swallowing - Can take over the counter probiotics daily for 30 days for management of loose stools  All questions were answered.      45, MD Gastroenterology  and Hepatology [facility]

## 2021-10-15 NOTE — Patient Instructions (Signed)
Schedule EGD Take magic mouthwash ever 6 hours as needed for pain swallowing Can take over the counter probiotics daily for 30 days for management of loose stools

## 2021-10-23 ENCOUNTER — Encounter (HOSPITAL_COMMUNITY)
Admission: RE | Admit: 2021-10-23 | Discharge: 2021-10-23 | Disposition: A | Discharge status: Home / Self Care | Payer: Medicare Other | Source: Ambulatory Visit | Attending: Gastroenterology | Admitting: Gastroenterology

## 2021-10-24 ENCOUNTER — Observation Stay (HOSPITAL_COMMUNITY): Payer: Medicare Other

## 2021-10-24 ENCOUNTER — Encounter (HOSPITAL_COMMUNITY): Payer: Self-pay

## 2021-10-24 ENCOUNTER — Other Ambulatory Visit: Payer: Self-pay

## 2021-10-24 ENCOUNTER — Inpatient Hospital Stay (HOSPITAL_COMMUNITY)
Admission: EM | Admit: 2021-10-24 | Discharge: 2021-10-29 | DRG: 385 | Disposition: A | Discharge status: Home / Self Care | Payer: Medicare Other | Attending: Internal Medicine | Admitting: Internal Medicine

## 2021-10-24 DIAGNOSIS — R1310 Dysphagia, unspecified: Secondary | ICD-10-CM | POA: Diagnosis present

## 2021-10-24 DIAGNOSIS — D638 Anemia in other chronic diseases classified elsewhere: Secondary | ICD-10-CM | POA: Diagnosis present

## 2021-10-24 DIAGNOSIS — E872 Acidosis, unspecified: Secondary | ICD-10-CM | POA: Diagnosis present

## 2021-10-24 DIAGNOSIS — K529 Noninfective gastroenteritis and colitis, unspecified: Secondary | ICD-10-CM | POA: Diagnosis present

## 2021-10-24 DIAGNOSIS — K6289 Other specified diseases of anus and rectum: Secondary | ICD-10-CM | POA: Diagnosis not present

## 2021-10-24 DIAGNOSIS — F840 Autistic disorder: Secondary | ICD-10-CM | POA: Diagnosis not present

## 2021-10-24 DIAGNOSIS — K625 Hemorrhage of anus and rectum: Secondary | ICD-10-CM

## 2021-10-24 DIAGNOSIS — Z20822 Contact with and (suspected) exposure to covid-19: Secondary | ICD-10-CM | POA: Diagnosis present

## 2021-10-24 DIAGNOSIS — D500 Iron deficiency anemia secondary to blood loss (chronic): Secondary | ICD-10-CM | POA: Diagnosis present

## 2021-10-24 DIAGNOSIS — R7989 Other specified abnormal findings of blood chemistry: Secondary | ICD-10-CM

## 2021-10-24 DIAGNOSIS — R509 Fever, unspecified: Secondary | ICD-10-CM

## 2021-10-24 DIAGNOSIS — K5010 Crohn's disease of large intestine without complications: Principal | ICD-10-CM | POA: Diagnosis present

## 2021-10-24 DIAGNOSIS — E871 Hypo-osmolality and hyponatremia: Secondary | ICD-10-CM | POA: Diagnosis present

## 2021-10-24 DIAGNOSIS — R531 Weakness: Secondary | ICD-10-CM

## 2021-10-24 DIAGNOSIS — R6510 Systemic inflammatory response syndrome (SIRS) of non-infectious origin without acute organ dysfunction: Secondary | ICD-10-CM | POA: Diagnosis present

## 2021-10-24 DIAGNOSIS — E663 Overweight: Secondary | ICD-10-CM | POA: Diagnosis present

## 2021-10-24 DIAGNOSIS — K922 Gastrointestinal hemorrhage, unspecified: Secondary | ICD-10-CM | POA: Diagnosis present

## 2021-10-24 DIAGNOSIS — E43 Unspecified severe protein-calorie malnutrition: Secondary | ICD-10-CM | POA: Diagnosis present

## 2021-10-24 DIAGNOSIS — R338 Other retention of urine: Secondary | ICD-10-CM | POA: Diagnosis present

## 2021-10-24 DIAGNOSIS — Z79899 Other long term (current) drug therapy: Secondary | ICD-10-CM

## 2021-10-24 DIAGNOSIS — N401 Enlarged prostate with lower urinary tract symptoms: Secondary | ICD-10-CM | POA: Diagnosis present

## 2021-10-24 DIAGNOSIS — E876 Hypokalemia: Secondary | ICD-10-CM | POA: Diagnosis present

## 2021-10-24 DIAGNOSIS — Z6825 Body mass index (BMI) 25.0-25.9, adult: Secondary | ICD-10-CM

## 2021-10-24 DIAGNOSIS — K611 Rectal abscess: Secondary | ICD-10-CM | POA: Diagnosis present

## 2021-10-24 DIAGNOSIS — Z8659 Personal history of other mental and behavioral disorders: Secondary | ICD-10-CM

## 2021-10-24 DIAGNOSIS — K259 Gastric ulcer, unspecified as acute or chronic, without hemorrhage or perforation: Secondary | ICD-10-CM | POA: Diagnosis present

## 2021-10-24 DIAGNOSIS — R000 Tachycardia, unspecified: Secondary | ICD-10-CM

## 2021-10-24 LAB — CBC WITH DIFFERENTIAL/PLATELET
Abs Immature Granulocytes: 0.04 10*3/uL (ref 0.00–0.07)
Basophils Absolute: 0 10*3/uL (ref 0.0–0.1)
Basophils Relative: 1 %
Eosinophils Absolute: 0.2 10*3/uL (ref 0.0–0.5)
Eosinophils Relative: 2 %
HCT: 37.4 % — ABNORMAL LOW (ref 39.0–52.0)
Hemoglobin: 11.6 g/dL — ABNORMAL LOW (ref 13.0–17.0)
Immature Granulocytes: 1 %
Lymphocytes Relative: 16 %
Lymphs Abs: 1.4 10*3/uL (ref 0.7–4.0)
MCH: 24 pg — ABNORMAL LOW (ref 26.0–34.0)
MCHC: 31 g/dL (ref 30.0–36.0)
MCV: 77.3 fL — ABNORMAL LOW (ref 80.0–100.0)
Monocytes Absolute: 1.6 10*3/uL — ABNORMAL HIGH (ref 0.1–1.0)
Monocytes Relative: 19 %
Neutro Abs: 5 10*3/uL (ref 1.7–7.7)
Neutrophils Relative %: 61 %
Platelets: 538 10*3/uL — ABNORMAL HIGH (ref 150–400)
RBC: 4.84 MIL/uL (ref 4.22–5.81)
RDW: 15 % (ref 11.5–15.5)
WBC: 8.2 10*3/uL (ref 4.0–10.5)
nRBC: 0 % (ref 0.0–0.2)

## 2021-10-24 LAB — COMPREHENSIVE METABOLIC PANEL
ALT: 12 U/L (ref 0–44)
AST: 21 U/L (ref 15–41)
Albumin: 2.5 g/dL — ABNORMAL LOW (ref 3.5–5.0)
Alkaline Phosphatase: 66 U/L (ref 38–126)
Anion gap: 16 — ABNORMAL HIGH (ref 5–15)
BUN: 5 mg/dL — ABNORMAL LOW (ref 6–20)
CO2: 23 mmol/L (ref 22–32)
Calcium: 9 mg/dL (ref 8.9–10.3)
Chloride: 97 mmol/L — ABNORMAL LOW (ref 98–111)
Creatinine, Ser: 0.79 mg/dL (ref 0.61–1.24)
GFR, Estimated: >60 mL/min (ref >60)
Glucose, Bld: 105 mg/dL — ABNORMAL HIGH (ref 70–99)
Potassium: 3.7 mmol/L (ref 3.5–5.1)
Sodium: 136 mmol/L (ref 135–145)
Total Bilirubin: 0.6 mg/dL (ref 0.3–1.2)
Total Protein: 7.7 g/dL (ref 6.5–8.1)

## 2021-10-24 LAB — PROTIME-INR
INR: 1.4 — ABNORMAL HIGH (ref 0.8–1.2)
Prothrombin Time: 17.1 seconds — ABNORMAL HIGH (ref 11.4–15.2)

## 2021-10-24 LAB — TYPE AND SCREEN
ABO/RH(D): O POS
Antibody Screen: NEGATIVE

## 2021-10-24 LAB — LACTIC ACID, PLASMA
Lactic Acid, Venous: 3 mmol/L (ref 0.5–1.9)
Lactic Acid, Venous: 1.4 mmol/L (ref 0.5–1.9)

## 2021-10-24 LAB — POC OCCULT BLOOD, ED: Fecal Occult Bld: POSITIVE — AB

## 2021-10-24 LAB — PROCALCITONIN: Procalcitonin: <0.1 ng/mL

## 2021-10-24 LAB — C-REACTIVE PROTEIN: CRP: 20 mg/dL — ABNORMAL HIGH (ref <1.0)

## 2021-10-24 LAB — SARS CORONAVIRUS 2 BY RT PCR: SARS Coronavirus 2 by RT PCR: NEGATIVE

## 2021-10-24 MED ORDER — VANCOMYCIN HCL IN DEXTROSE 1-5 GM/200ML-% IV SOLN
1000.0000 mg | Freq: Once | INTRAVENOUS | Status: AC
  Administered 2021-10-25: 1000 mg via INTRAVENOUS
  Filled 2021-10-24: qty 200

## 2021-10-24 MED ORDER — ACETAMINOPHEN 325 MG PO TABS
650.0000 mg | ORAL_TABLET | ORAL | Status: DC | PRN
  Administered 2021-10-24 – 2021-10-25 (×2): 650 mg via ORAL
  Filled 2021-10-24 (×2): qty 2

## 2021-10-24 MED ORDER — PANTOPRAZOLE SODIUM 40 MG IV SOLR
40.0000 mg | Freq: Two times a day (BID) | INTRAVENOUS | Status: DC
  Administered 2021-10-25 – 2021-10-28 (×7): 40 mg via INTRAVENOUS
  Filled 2021-10-24 (×7): qty 10

## 2021-10-24 MED ORDER — SODIUM CHLORIDE 0.9 % IV BOLUS
1000.0000 mL | Freq: Once | INTRAVENOUS | Status: AC
  Administered 2021-10-24: 1000 mL via INTRAVENOUS

## 2021-10-24 MED ORDER — ONDANSETRON HCL 4 MG/2ML IJ SOLN
4.0000 mg | Freq: Four times a day (QID) | INTRAMUSCULAR | Status: DC | PRN
  Administered 2021-10-27: 4 mg via INTRAVENOUS
  Filled 2021-10-24: qty 2

## 2021-10-24 MED ORDER — PIPERACILLIN-TAZOBACTAM 3.375 G IVPB 30 MIN
3.3750 g | Freq: Once | INTRAVENOUS | Status: AC
  Administered 2021-10-24: 3.375 g via INTRAVENOUS
  Filled 2021-10-24: qty 50

## 2021-10-24 MED ORDER — IOHEXOL 300 MG/ML  SOLN
100.0000 mL | Freq: Once | INTRAMUSCULAR | Status: AC | PRN
  Administered 2021-10-24: 100 mL via INTRAVENOUS

## 2021-10-24 MED ORDER — ONDANSETRON HCL 4 MG/2ML IJ SOLN
4.0000 mg | Freq: Once | INTRAMUSCULAR | Status: AC
  Administered 2021-10-24: 4 mg via INTRAVENOUS
  Filled 2021-10-24: qty 2

## 2021-10-24 MED ORDER — METRONIDAZOLE 500 MG/100ML IV SOLN
500.0000 mg | Freq: Once | INTRAVENOUS | Status: AC
Start: 2021-10-24 — End: 2021-10-24
  Administered 2021-10-24: 500 mg via INTRAVENOUS
  Filled 2021-10-24 (×2): qty 100

## 2021-10-24 MED ORDER — MORPHINE SULFATE (PF) 4 MG/ML IV SOLN
4.0000 mg | Freq: Once | INTRAVENOUS | Status: DC
  Filled 2021-10-24: qty 1

## 2021-10-24 MED ORDER — LACTATED RINGERS IV BOLUS
1000.0000 mL | Freq: Once | INTRAVENOUS | Status: AC
  Administered 2021-10-24: 1000 mL via INTRAVENOUS

## 2021-10-24 MED ORDER — SODIUM CHLORIDE 0.9% FLUSH
3.0000 mL | Freq: Two times a day (BID) | INTRAVENOUS | Status: DC
  Administered 2021-10-25 – 2021-10-29 (×8): 3 mL via INTRAVENOUS

## 2021-10-24 MED ORDER — LACTATED RINGERS IV SOLN
INTRAVENOUS | Status: AC
Start: 2021-10-24 — End: 2021-10-25

## 2021-10-24 MED ORDER — POLYETHYLENE GLYCOL 3350 17 G PO PACK: 17.0000 g | PACK | Freq: Every day | ORAL | Status: DC | PRN

## 2021-10-24 MED ORDER — ONDANSETRON HCL 4 MG PO TABS: 4.0000 mg | ORAL_TABLET | Freq: Four times a day (QID) | ORAL | Status: DC | PRN

## 2021-10-24 MED ORDER — PANTOPRAZOLE SODIUM 40 MG IV SOLR
40.0000 mg | Freq: Once | INTRAVENOUS | Status: AC
  Administered 2021-10-24: 40 mg via INTRAVENOUS
  Filled 2021-10-24: qty 10

## 2021-10-24 NOTE — Assessment & Plan Note (Signed)
   Minimally verbal  Frequent nursing rounding  Minimizing uncomfortable stimuli  Encouraging family to remain at bedside is much as possible

## 2021-10-24 NOTE — Assessment & Plan Note (Signed)
Lactic acidosis likely secondary to degree of volume depletion in the setting of poor oral intake  Underlying infection is also possibility  hydrate patient intravenous isotonic fluids while concurrently evaluating for possibility of underlying infection Monitoring serial lactic acid levels to ensure downtrending and resolution.

## 2021-10-24 NOTE — ED Triage Notes (Addendum)
Pt recently seen for vomiting blood a few weeks ago. Today patient has had dark red blood clots in stool twice today. Pt c/o pain in upper abdomen and throat.  Pt has lost 35lb in six weeks per mother.

## 2021-10-24 NOTE — Assessment & Plan Note (Signed)
   Best way to approach evaluating this ongoing problem is to proceed with a EGD   Obtaining gastroenterology consultation as noted above

## 2021-10-24 NOTE — H&P (Signed)
History and Physical    Patient: Donald Gallagher MRN: [id number] DOA: 10/24/2021  Date of Service: the patient was seen and examined on 10/24/2021  Patient coming from: Home  Chief Complaint:  Chief Complaint  Patient presents with   Rectal Bleeding    HPI:   41 year old male with past medical history of autism presenting with bright red blood per rectum.  Of note, patient is minimally verbal due to autism and is only able to give yes/no answers.  Majority the history has been obtained from discussions with the emergency department staff, review of notes and discussions with family.  Patient has been experiencing an approximate 57-month history of cough and difficulty swallowing.  Symptoms have been evaluated multiple times with various providers including outpatient evaluation by his primary care provider.  Multiple work-ups have ensued including 2 negative strep tests and a course of Augmentin for suspected pharyngitis.  Most recently, patient presented to [facility] emergency department [facility]/27 due to an episode of an episode of possible hematemesis, persisting cough/throat pain and decreasing ability to tolerate oral intake.  During that evaluation, CT imaging of the abdomen and pelvis was performed which was unremarkable.  CBC was noted to be 12.5 and the patient was therefore discharged home on a daily PPI with instructions to follow-up as an outpatient with ENT and GI.  Since that visit, patient has followed up as an outpatient with GI in  and plan was for patient to to undergo an EGD next week.  Patient has continued to exhibit extremely poor oral intake since.  Earlier in the day on 7/14, patient's mother reports  2 bloody bowel movements comprised of dark red blood with clots.  Mother also reports the patient's been complaining of intermittent abdominal pain although his ability to communicate is limited.  Mother denies any alcohol use NSAID use or ingestion of foreign objects.   Upon further questioning mother also attests to a several week history of frequent loose stools.  Patient continued to exhibit extremely poor oral intake over the past several weeks with an approximate 35 pound weight loss.  Patient eventually presented to [facility] emergency department for evaluation.  Upon evaluation in the emergency department patient has been found to exhibit multiple SIRS criteria including tachycardia tachypnea and fever of 102.2 F.  Patient was noted to have a lactic acidosis of 3.0.  Hemoglobin was noted to be 11.6.  Patient was given a bolus of isotonic fluids as well as a dose of intravenous Protonix.  Due to ongoing SIRS CT imaging of the abdomen pelvis was ordered but not performed and the hospitalist group was then called to assess the patient for admission to the hospital.   Review of Systems: Review of Systems  Unable to perform ROS: Mental acuity     Past Medical History:  Diagnosis Date   Autism     History reviewed. No pertinent surgical history.  Social History:  reports that he has never smoked. He has never used smokeless tobacco. He reports that he does not drink alcohol and does not use drugs.  No Known Allergies  Family History  Problem Relation Age of Onset   Diabetes Father     Prior to Admission medications   Medication Sig Start Date End Date Taking? Authorizing Provider  acetaminophen (TYLENOL) 500 MG tablet Take 1,000 mg by mouth every 6 (six) hours as needed for fever.   Yes [provider]  bismuth subsalicylate (PEPTO BISMOL) 262 MG/15ML suspension Take  30 mLs by mouth every 6 (six) hours as needed for indigestion or diarrhea or loose stools.   Yes [provider]  magic mouthwash (nystatin, diphenhydrAMINE, alum & mag hydroxide) suspension mixture Swish and swallow 5 mLs 4 (four) times daily. 10/15/21  Yes Dolores Frame, MD  pantoprazole (PROTONIX) 40 MG tablet Take 1 tablet (40 mg total) by  mouth daily. 10/07/21  Yes Triplett, Tammy, PA-C  amoxicillin-clavulanate (AUGMENTIN) 875-125 MG tablet Take 1 tablet by mouth 2 (two) times daily. Patient not taking: Reported on 10/20/2021 09/30/21   Daryll Drown, NP    Physical Exam:  Vitals:   10/24/21 1915 10/24/21 2006 10/24/21 2145 10/24/21 2220  BP: 120/88  122/82 126/89  Pulse: (!) 130  (!) 114 (!) 127  Resp: (!) 30  (!) 25 (!) 22  Temp:  (!) 102.2 F (39 C)  98.8 F (37.1 C)  TempSrc:  Rectal  Oral  SpO2: 99%  100%   Weight:      Height:        Constitutional: Awake and alert, minimally verbal, patient appears to be in distress due to abdominal pain  Skin: no rashes, no lesions, good skin turgor noted. Eyes: Pupils are equally reactive to light.  No evidence of scleral icterus or conjunctival pallor.  ENMT: Dry mucous membranes noted.  Posterior pharynx clear of any exudate or lesions.   Neck: normal, supple, no masses, no thyromegaly.  No evidence of jugular venous distension.   Respiratory: clear to auscultation bilaterally, no wheezing, no crackles. Normal respiratory effort. No accessory muscle use.  Cardiovascular: Tachycardic rate with regular rhythm, no murmurs / rubs / gallops. No extremity edema. 2+ pedal pulses. No carotid bruits.  Chest:   Nontender without crepitus or deformity.   Back:   Nontender without crepitus or deformity. Abdomen: Diffuse abdominal tenderness noted.  Abdomen is soft.  No evidence of intra-abdominal masses.  Positive bowel sounds noted in all quadrants.   Musculoskeletal: No joint deformity upper and lower extremities. Good ROM, no contractures. Normal muscle tone.  Neurologic: CN 2-12 grossly intact. Sensation intact.  Patient moving all 4 extremities spontaneously.  Patient is following all commands.  Patient is responsive to verbal stimuli.   Psychiatric: Minimally verbal and therefore difficult to assess.  Patient currently does not seem to possess insight as to his current  situation.  Data Reviewed:  I have personally reviewed and interpreted labs, imaging.  Significant findings are:  Chest x-ray personally reviewed revealing no evidence of cardiopulmonary disease Chemistry revealing sodium 136, potassium 3.7, chloride 97, bicarbonate 23, glucose 105, BUN 5, creatinine 0.79. CBC revealing white blood cell count of 8.2, hemoglobin 11.6, hematocrit 37.4, platelet count 538 COVID-19 PCR testing negative PT 17.1, INR 1.4  EKG: Personally reviewed.  Rhythm is sinus tachycardia with heart rate of 143 bpm.  No dynamic ST segment changes appreciated.   Assessment and Plan: * Acute upper GI bleed Question of possible hematemesis several weeks ago and now patient is presenting with 2 bowel movements comprised of dark red blood with clots Presence of several week history of odynophasia and poor oral intake suggest that this is likely upper GI in origin  Patient is a limited ability to provide a history due to being minimally verbal due to autism Initiating intravenous PPI every 12 hours Secure chat sent to gastroenterology to request a formal consultation in the morning for consideration of endoscopic evaluation Serial CBCs We will transfuse if hemoglobin drops less than 7  Type and screen obtained  SIRS (systemic inflammatory response syndrome) (HCC) Patient exhibiting multiple SIRS criteria including tachycardia, tachypnea and fever Concurrent lactic acidosis Unclear as to whether there is an underlying source of infection at this point Presence of autism with patient being minimally verbal obscures ability to identify the source Chest x-ray unremarkable Obtaining COVID-19 testing, blood cultures, procalcitonin, CRP, urinalysis and CT imaging of the chest abdomen and pelvis. We will treat with antibiotics as appropriate  Lactic acidosis Lactic acidosis likely secondary to degree of volume depletion in the setting of poor oral intake  Underlying infection  is also possibility  hydrate patient intravenous isotonic fluids while concurrently evaluating for possibility of underlying infection Monitoring serial lactic acid levels to ensure downtrending and resolution.   Odynophagia Best way to approach evaluating this ongoing problem is to proceed with a EGD  Obtaining gastroenterology consultation as noted above  Autistic disorder Minimally verbal Frequent nursing rounding Minimizing uncomfortable stimuli Encouraging family to remain at bedside is much as possible       Code Status:  Full code  code status decision has been confirmed with: mother Family Communication: Mother at the bedside has been updated on plan of care  Consults: Dr. Loreta Ave with GI  Severity of Illness:  The appropriate patient status for this patient is OBSERVATION. Observation status is judged to be reasonable and necessary in order to provide the required intensity of service to ensure the patient's safety. The patient's presenting symptoms, physical exam findings, and initial radiographic and laboratory data in the context of their medical condition is felt to place them at decreased risk for further clinical deterioration. Furthermore, it is anticipated that the patient will be medically stable for discharge from the hospital within 2 midnights of admission.   Author:  Marinda Elk MD  10/24/2021 10:48 PM

## 2021-10-24 NOTE — ED Provider Notes (Addendum)
[facility] EMERGENCY DEPARTMENT Provider Note   CSN: [id number] Arrival date & time: 10/24/21  1510     History  Chief Complaint  Patient presents with   Rectal Bleeding    Donald Gallagher is a 41 y.o. male.  Pt/family indicate noted dark red blood w clots in stool 2x/day. Had episode a couple weeks ago of possibly vomiting some blood. No emesis today. Has been eating somewhat less than normal in past 1-2 months, and has seen gi for dysphagia symptoms - has plans for EGD next week. Reported ~ 30 lbs wt loss in past few months. No hx gi bleeding, diverticula or pud. No anticoagulant use. Intermittently has c/o abd pain - recent ct for same neg for acute process then. No nsaid use. Pt limited historian, hx autism - level 5 caveat. No fevers.   The history is provided by the patient, a parent and medical records. The history is limited by the condition of the patient.  Rectal Bleeding Associated symptoms: no fever        Home Medications Prior to Admission medications   Medication Sig Start Date End Date Taking? Authorizing Provider  amoxicillin-clavulanate (AUGMENTIN) 875-125 MG tablet Take 1 tablet by mouth 2 (two) times daily. Patient not taking: Reported on 10/20/2021 09/30/21   Daryll Drown, NP  bismuth subsalicylate (PEPTO BISMOL) 262 MG/15ML suspension Take 30 mLs by mouth every 6 (six) hours as needed for indigestion or diarrhea or loose stools.    [provider]  magic mouthwash (nystatin, diphenhydrAMINE, alum & mag hydroxide) suspension mixture Swish and swallow 5 mLs 4 (four) times daily. 10/15/21   Dolores Frame, MD  pantoprazole (PROTONIX) 40 MG tablet Take 1 tablet (40 mg total) by mouth daily. 10/07/21   Triplett, Babette Relic, PA-C      Allergies    Patient has no known allergies.    Review of Systems   Review of Systems  Unable to perform ROS: Other  Constitutional:  Negative for fever.  Gastrointestinal:  Positive for anal  bleeding and hematochezia.  Neurological:  Negative for syncope.    Physical Exam Updated Vital Signs BP 117/85 (BP Location: Left Arm)   Pulse (!) 137   Temp 98.5 F (36.9 C) (Oral)   Resp (!) 24   Ht 1.676 m (5\' 6" )   Wt 72.6 kg   SpO2 97%   BMI 25.82 kg/m  Physical Exam Vitals and nursing note reviewed.  Constitutional:      Appearance: Normal appearance. He is well-developed.  HENT:     Head: Atraumatic.     Nose: Nose normal.     Mouth/Throat:     Mouth: Mucous membranes are moist.     Pharynx: Oropharynx is clear.  Eyes:     General: No scleral icterus.    Conjunctiva/sclera: Conjunctivae normal.  Neck:     Trachea: No tracheal deviation.     Comments: Thyroid not grossly enlarged or tender.  Cardiovascular:     Rate and Rhythm: Regular rhythm. Tachycardia present.     Pulses: Normal pulses.     Heart sounds: Normal heart sounds. No murmur heard.    No friction rub. No gallop.  Pulmonary:     Effort: Pulmonary effort is normal. No accessory muscle usage or respiratory distress.     Breath sounds: Normal breath sounds.  Abdominal:     General: Bowel sounds are normal. There is no distension.     Palpations: Abdomen is soft. There  is no mass.     Tenderness: There is no abdominal tenderness. There is no guarding.  Genitourinary:    Comments: No cva tenderness. ?right perirectal abscess, spontaneously draining as small amt purulent material on glove - very limited exam due to pt extremely uncooperative w several attempts to thoroughly examine area and to perform rectal exam, turning away from examiner, pushing hands away, etc. No crepitus or grossly necrotic tissue noted to area.   Stool light brown, streak brb.  Musculoskeletal:        General: No swelling.     Cervical back: Normal range of motion and neck supple. No rigidity.  Skin:    General: Skin is warm and dry.     Findings: No rash.  Neurological:     Mental Status: He is alert.     Comments: Alert,  speech clear.   Psychiatric:        Mood and Affect: Mood normal.     ED Results / Procedures / Treatments   Labs (all labs ordered are listed, but only abnormal results are displayed) Results for orders placed or performed during the hospital encounter of 10/24/21  CBC with Differential  Result Value Ref Range   WBC 8.2 4.0 - 10.5 K/uL   RBC 4.84 4.22 - 5.81 MIL/uL   Hemoglobin 11.6 (L) 13.0 - 17.0 g/dL   HCT 33.5 (L) 45.6 - 25.6 %   MCV 77.3 (L) 80.0 - 100.0 fL   MCH 24.0 (L) 26.0 - 34.0 pg   MCHC 31.0 30.0 - 36.0 g/dL   RDW 38.9 37.3 - 42.8 %   Platelets 538 (H) 150 - 400 K/uL   nRBC 0.0 0.0 - 0.2 %   Neutrophils Relative % PENDING %   Neutro Abs PENDING 1.7 - 7.7 K/uL   Band Neutrophils PENDING %   Lymphocytes Relative PENDING %   Lymphs Abs PENDING 0.7 - 4.0 K/uL   Monocytes Relative PENDING %   Monocytes Absolute PENDING 0.1 - 1.0 K/uL   Eosinophils Relative PENDING %   Eosinophils Absolute PENDING 0.0 - 0.5 K/uL   Basophils Relative PENDING %   Basophils Absolute PENDING 0.0 - 0.1 K/uL   WBC Morphology PENDING    RBC Morphology PENDING    Smear Review PENDING    Other PENDING %   nRBC PENDING 0 /100 WBC   Metamyelocytes Relative PENDING %   Myelocytes PENDING %   Promyelocytes Relative PENDING %   Blasts PENDING %   Immature Granulocytes PENDING %   Abs Immature Granulocytes PENDING 0.00 - 0.07 K/uL  Protime-INR  Result Value Ref Range   Prothrombin Time 17.1 (H) 11.4 - 15.2 seconds   INR 1.4 (H) 0.8 - 1.2  POC occult blood, ED  Result Value Ref Range   Fecal Occult Bld POSITIVE (A) NEGATIVE  Type and screen [facility]  Result Value Ref Range   ABO/RH(D) O POS    Antibody Screen NEG    Sample Expiration      10/27/2021,2359 Performed at [facility], [address] [place], [place] [address]    CT ABDOMEN PELVIS W CONTRAST  Result Date: 10/07/2021 CLINICAL DATA:  Abdominal pain, acute, nonlocalized EXAM: CT ABDOMEN AND  PELVIS WITH CONTRAST TECHNIQUE: Multidetector CT imaging of the abdomen and pelvis was performed using the standard protocol following bolus administration of intravenous contrast. RADIATION DOSE REDUCTION: This exam was performed according to the departmental dose-optimization program which includes automated exposure control,  adjustment of the mA and/or kV according to patient size and/or use of iterative reconstruction technique. CONTRAST:  OMNIPAQUE IOHEXOL 300 MG/ML  SOLN COMPARISON:  None Available. FINDINGS: Lower chest: No acute abnormality. Hepatobiliary: No focal liver abnormality is seen. The gallbladder is unremarkable. Pancreas: Unremarkable. No pancreatic ductal dilatation or surrounding inflammatory changes. Spleen: Normal in size without focal abnormality. Adrenals/Urinary Tract: Adrenal glands are unremarkable. No hydronephrosis or nephrolithiasis. The bladder is moderately distended. Stomach/Bowel: The stomach is within normal limits. There is no evidence of bowel obstruction.The appendix is normal. Vascular/Lymphatic: No significant vascular findings are present. No enlarged abdominal or pelvic lymph nodes. Reproductive: Mildly enlarged prostate gland. Other: No abdominal wall hernia or abnormality. No abdominopelvic ascites. Musculoskeletal: No acute or significant osseous findings. IMPRESSION: No acute abdominopelvic abnormality.  Normal appendix. Electronically Signed   By: Caprice Renshaw M.D.   On: 10/07/2021 14:41      EKG EKG Interpretation  Date/Time:  Friday October 24 2021 16:37:32 EDT Ventricular Rate:  143 PR Interval:  88 QRS Duration: 78 QT Interval:  356 QTC Calculation: 549 R Axis:   22 Text Interpretation: Narrow QRS tachycardia Confirmed by Cathren Laine ([id number]) on 10/24/2021 5:23:34 PM  Radiology No results found.  Procedures Procedures    Medications Ordered in ED Medications - No data to display  ED Course/ Medical Decision Making/ A&P                            Medical Decision Making Problems Addressed: Acute febrile illness: acute illness or injury with systemic symptoms that poses a threat to life or bodily functions Elevated lactic acid level: acute illness or injury with systemic symptoms that poses a threat to life or bodily functions History of autism: chronic illness or injury Rectal bleeding: acute illness or injury with systemic symptoms that poses a threat to life or bodily functions Rectal pain: acute illness or injury Tachycardia: acute illness or injury with systemic symptoms Weakness: acute illness or injury with systemic symptoms that poses a threat to life or bodily functions  Amount and/or Complexity of Data Reviewed Independent Historian: parent    Details: hx External Data Reviewed: notes. Labs: ordered. Decision-making details documented in ED Course. Radiology: ordered and independent interpretation performed. Decision-making details documented in ED Course. ECG/medicine tests: ordered and independent interpretation performed. Decision-making details documented in ED Course. Discussion of management or test interpretation with external provider(s): Medicine phys - discussed pt.   Risk Prescription drug management. Parenteral controlled substances. Decision regarding hospitalization.   Iv ns. Continuous pulse ox and cardiac monitoring. Labs ordered/sent. Imaging ordered.   Reviewed nursing notes and prior charts for additional history. External reports reviewed. Additional history from: family.   Cardiac monitor: sinus rhythm, rate 140.   Labs reviewed/interpreted by me - hgb 11.6 mildly decreased from prior. Stool is heme positive. Given recurrent rectal bleeding, weakness, tachy, heme pos stools - will consult hospitalists for admission.   Ns bolus iv. Protonix iv.  Recent ct 6/27 reviewed/interpreted by me - no acute infxn/process then.  Unassigned medicine consulted for admission.  Recheck pt felt  warm, initial temp normal, no doc fevers at home. Rectal temp checked - is elevated. Lactate is high. Ivf boluses. Suspect right rectal/anal infection - pt remains very non cooperative w attempts to examine. Abd soft non tender.  Ct pelvis order to further assess. Iv abx ordered. Hospitalists updated.   Acetaminophen po. Lr bolus. Morphine for  pain.   CRITICAL CARE RE: elevated lactate, sepsis, r/o anal/rectal infection, r/o gi bleeding.  Performed by: Suzi Roots Total critical care time: 40 minutes Critical care time was exclusive of separately billable procedures and treating other patients. Critical care was necessary to treat or prevent imminent or life-threatening deterioration. Critical care was time spent personally by me on the following activities: development of treatment plan with patient and/or surrogate as well as nursing, discussions with consultants, evaluation of patient's response to treatment, examination of patient, obtaining history from patient or surrogate, ordering and performing treatments and interventions, ordering and review of laboratory studies, ordering and review of radiographic studies, pulse oximetry and re-evaluation of patient's condition.          Final Clinical Impression(s) / ED Diagnoses Final diagnoses:  None    Rx / DC Orders ED Discharge Orders     None         Cathren Laine, MD 10/24/21 Herbie Baltimore    Cathren Laine, MD 10/24/21 2055

## 2021-10-24 NOTE — Assessment & Plan Note (Deleted)
   Question of possible hematemesis several weeks ago and now patient is presenting with 2 bowel movements comprised of dark red blood with clots  Presence of several week history of odynophasia and poor oral intake suggest that this is likely upper GI in origin   Patient is a limited ability to provide a history due to being minimally verbal due to autism  Initiating intravenous PPI every 12 hours  Secure chat sent to gastroenterology to request a formal consultation in the morning for consideration of endoscopic evaluation  Serial CBCs  We will transfuse if hemoglobin drops less than 7  Type and screen obtained

## 2021-10-24 NOTE — Assessment & Plan Note (Deleted)
   Patient exhibiting multiple SIRS criteria including tachycardia, tachypnea and fever  Concurrent lactic acidosis  Unclear as to whether there is an underlying source of infection at this point  Presence of autism with patient being minimally verbal obscures ability to identify the source  Chest x-ray unremarkable  Obtaining COVID-19 testing, blood cultures, procalcitonin, CRP, urinalysis and CT imaging of the chest abdomen and pelvis.  We will treat with antibiotics as appropriate

## 2021-10-24 NOTE — ED Notes (Signed)
Pt family reports pt having heavy dark red rectal bleeding w/ clots

## 2021-10-25 DIAGNOSIS — K921 Melena: Secondary | ICD-10-CM | POA: Diagnosis not present

## 2021-10-25 DIAGNOSIS — K6289 Other specified diseases of anus and rectum: Secondary | ICD-10-CM | POA: Diagnosis present

## 2021-10-25 DIAGNOSIS — K626 Ulcer of anus and rectum: Secondary | ICD-10-CM | POA: Diagnosis not present

## 2021-10-25 DIAGNOSIS — K611 Rectal abscess: Secondary | ICD-10-CM | POA: Diagnosis present

## 2021-10-25 DIAGNOSIS — D638 Anemia in other chronic diseases classified elsewhere: Secondary | ICD-10-CM | POA: Diagnosis present

## 2021-10-25 DIAGNOSIS — D500 Iron deficiency anemia secondary to blood loss (chronic): Secondary | ICD-10-CM | POA: Diagnosis present

## 2021-10-25 DIAGNOSIS — N401 Enlarged prostate with lower urinary tract symptoms: Secondary | ICD-10-CM | POA: Diagnosis present

## 2021-10-25 DIAGNOSIS — Z79899 Other long term (current) drug therapy: Secondary | ICD-10-CM | POA: Diagnosis not present

## 2021-10-25 DIAGNOSIS — E43 Unspecified severe protein-calorie malnutrition: Secondary | ICD-10-CM | POA: Diagnosis present

## 2021-10-25 DIAGNOSIS — K633 Ulcer of intestine: Secondary | ICD-10-CM | POA: Diagnosis not present

## 2021-10-25 DIAGNOSIS — Z20822 Contact with and (suspected) exposure to covid-19: Secondary | ICD-10-CM | POA: Diagnosis present

## 2021-10-25 DIAGNOSIS — E871 Hypo-osmolality and hyponatremia: Secondary | ICD-10-CM | POA: Diagnosis present

## 2021-10-25 DIAGNOSIS — K603 Anal fistula: Secondary | ICD-10-CM | POA: Diagnosis not present

## 2021-10-25 DIAGNOSIS — R6510 Systemic inflammatory response syndrome (SIRS) of non-infectious origin without acute organ dysfunction: Secondary | ICD-10-CM | POA: Diagnosis present

## 2021-10-25 DIAGNOSIS — K922 Gastrointestinal hemorrhage, unspecified: Secondary | ICD-10-CM | POA: Diagnosis not present

## 2021-10-25 DIAGNOSIS — R1310 Dysphagia, unspecified: Secondary | ICD-10-CM | POA: Diagnosis present

## 2021-10-25 DIAGNOSIS — K529 Noninfective gastroenteritis and colitis, unspecified: Secondary | ICD-10-CM | POA: Diagnosis present

## 2021-10-25 DIAGNOSIS — R338 Other retention of urine: Secondary | ICD-10-CM | POA: Diagnosis not present

## 2021-10-25 DIAGNOSIS — K259 Gastric ulcer, unspecified as acute or chronic, without hemorrhage or perforation: Secondary | ICD-10-CM | POA: Diagnosis present

## 2021-10-25 DIAGNOSIS — Z6825 Body mass index (BMI) 25.0-25.9, adult: Secondary | ICD-10-CM | POA: Diagnosis not present

## 2021-10-25 DIAGNOSIS — E663 Overweight: Secondary | ICD-10-CM | POA: Diagnosis present

## 2021-10-25 DIAGNOSIS — E872 Acidosis, unspecified: Secondary | ICD-10-CM | POA: Diagnosis present

## 2021-10-25 DIAGNOSIS — F840 Autistic disorder: Secondary | ICD-10-CM | POA: Diagnosis not present

## 2021-10-25 DIAGNOSIS — K5010 Crohn's disease of large intestine without complications: Secondary | ICD-10-CM | POA: Diagnosis present

## 2021-10-25 DIAGNOSIS — R634 Abnormal weight loss: Secondary | ICD-10-CM | POA: Diagnosis not present

## 2021-10-25 DIAGNOSIS — E876 Hypokalemia: Secondary | ICD-10-CM | POA: Diagnosis not present

## 2021-10-25 LAB — C DIFFICILE QUICK SCREEN W PCR REFLEX
C Diff antigen: NEGATIVE
C Diff interpretation: NOT DETECTED
C Diff toxin: NEGATIVE

## 2021-10-25 LAB — MAGNESIUM: Magnesium: 1.7 mg/dL (ref 1.7–2.4)

## 2021-10-25 LAB — COMPREHENSIVE METABOLIC PANEL
ALT: 9 U/L (ref 0–44)
AST: 9 U/L — ABNORMAL LOW (ref 15–41)
Albumin: 2 g/dL — ABNORMAL LOW (ref 3.5–5.0)
Alkaline Phosphatase: 53 U/L (ref 38–126)
Anion gap: 7 (ref 5–15)
BUN: 6 mg/dL (ref 6–20)
CO2: 24 mmol/L (ref 22–32)
Calcium: 8.3 mg/dL — ABNORMAL LOW (ref 8.9–10.3)
Chloride: 104 mmol/L (ref 98–111)
Creatinine, Ser: 0.77 mg/dL (ref 0.61–1.24)
GFR, Estimated: >60 mL/min (ref >60)
Glucose, Bld: 125 mg/dL — ABNORMAL HIGH (ref 70–99)
Potassium: 3.5 mmol/L (ref 3.5–5.1)
Sodium: 135 mmol/L (ref 135–145)
Total Bilirubin: 0.9 mg/dL (ref 0.3–1.2)
Total Protein: 6.2 g/dL — ABNORMAL LOW (ref 6.5–8.1)

## 2021-10-25 LAB — URINALYSIS, ROUTINE W REFLEX MICROSCOPIC
Bilirubin Urine: NEGATIVE
Glucose, UA: NEGATIVE mg/dL
Hgb urine dipstick: NEGATIVE
Ketones, ur: 5 mg/dL — AB
Leukocytes,Ua: NEGATIVE
Nitrite: NEGATIVE
Protein, ur: NEGATIVE mg/dL
Specific Gravity, Urine: 1.036 — ABNORMAL HIGH (ref 1.005–1.030)
pH: 5 (ref 5.0–8.0)

## 2021-10-25 LAB — CBC
HCT: 29.7 % — ABNORMAL LOW (ref 39.0–52.0)
HCT: 28.4 % — ABNORMAL LOW (ref 39.0–52.0)
Hemoglobin: 9.5 g/dL — ABNORMAL LOW (ref 13.0–17.0)
Hemoglobin: 9.2 g/dL — ABNORMAL LOW (ref 13.0–17.0)
MCH: 23.8 pg — ABNORMAL LOW (ref 26.0–34.0)
MCH: 23.9 pg — ABNORMAL LOW (ref 26.0–34.0)
MCHC: 32 g/dL (ref 30.0–36.0)
MCHC: 32.4 g/dL (ref 30.0–36.0)
MCV: 74.3 fL — ABNORMAL LOW (ref 80.0–100.0)
MCV: 73.8 fL — ABNORMAL LOW (ref 80.0–100.0)
Platelets: 420 10*3/uL — ABNORMAL HIGH (ref 150–400)
Platelets: 401 10*3/uL — ABNORMAL HIGH (ref 150–400)
RBC: 4 MIL/uL — ABNORMAL LOW (ref 4.22–5.81)
RBC: 3.85 MIL/uL — ABNORMAL LOW (ref 4.22–5.81)
RDW: 14.8 % (ref 11.5–15.5)
RDW: 14.9 % (ref 11.5–15.5)
WBC: 7.9 10*3/uL (ref 4.0–10.5)
WBC: 6.9 10*3/uL (ref 4.0–10.5)
nRBC: 0 % (ref 0.0–0.2)
nRBC: 0 % (ref 0.0–0.2)

## 2021-10-25 LAB — GLUCOSE, CAPILLARY: Glucose-Capillary: 104 mg/dL — ABNORMAL HIGH (ref 70–99)

## 2021-10-25 LAB — HIV ANTIBODY (ROUTINE TESTING W REFLEX): HIV Screen 4th Generation wRfx: NONREACTIVE

## 2021-10-25 MED ORDER — OXYCODONE HCL 5 MG PO TABS: 5.0000 mg | ORAL_TABLET | Freq: Four times a day (QID) | ORAL | Status: DC | PRN

## 2021-10-25 MED ORDER — HYDROMORPHONE HCL 1 MG/ML IJ SOLN: 0.5000 mg | INTRAMUSCULAR | Status: DC | PRN

## 2021-10-25 MED ORDER — LACTATED RINGERS IV SOLN: INTRAVENOUS | Status: AC

## 2021-10-25 MED ORDER — PEG 3350-KCL-NA BICARB-NACL 420 G PO SOLR
4000.0000 mL | Freq: Once | ORAL | Status: AC
  Administered 2021-10-25: 4000 mL via ORAL
  Filled 2021-10-25: qty 4000

## 2021-10-25 MED ORDER — INSULIN ASPART 100 UNIT/ML IJ SOLN: 0.0000 [IU] | INTRAMUSCULAR | Status: DC

## 2021-10-25 MED ORDER — METRONIDAZOLE 500 MG/100ML IV SOLN
500.0000 mg | Freq: Two times a day (BID) | INTRAVENOUS | Status: DC
  Administered 2021-10-25 – 2021-10-27 (×6): 500 mg via INTRAVENOUS
  Filled 2021-10-25 (×7): qty 100

## 2021-10-25 MED ORDER — CIPROFLOXACIN IN D5W 400 MG/200ML IV SOLN
400.0000 mg | Freq: Two times a day (BID) | INTRAVENOUS | Status: DC
  Administered 2021-10-25 – 2021-10-28 (×6): 400 mg via INTRAVENOUS
  Filled 2021-10-25 (×6): qty 200

## 2021-10-25 NOTE — Progress Notes (Signed)
PROGRESS NOTE    Donald Gallagher  DGL:[id number] DOB: 10-30-1980 DOA: 10/24/2021 PCP: Junie Spencer, FNP    Brief Narrative:  41 year old male with past medical history of autism presenting with bright red blood per rectum.   Of note, patient is minimally verbal due to autism and is only able to give yes/no answers.  Majority the history has been obtained from discussions with the emergency department staff, review of notes and discussions with family.   Patient has been experiencing an approximate 9-month history of cough and difficulty swallowing.  Symptoms have been evaluated multiple times with various providers including outpatient evaluation by his primary care provider.  Multiple work-ups have ensued including 2 negative strep tests and a course of Augmentin for suspected pharyngitis.   Most recently, patient presented to [facility] emergency department 6/27 due to an episode of an episode of possible hematemesis, persisting cough/throat pain and decreasing ability to tolerate oral intake.  During that evaluation, CT imaging of the abdomen and pelvis was performed which was unremarkable.  CBC was noted to be 12.5 and the patient was therefore discharged home on a daily PPI with instructions to follow-up as an outpatient with ENT and GI.   Since that visit, patient has followed up as an outpatient with GI in [place] and plan was for patient to to undergo an EGD next week.  Patient has continued to exhibit extremely poor oral intake since.   Earlier in the day on 7/14, patient's mother reports  2 bloody bowel movements comprised of dark red blood with clots.  Mother also reports the patient's been complaining of intermittent abdominal pain although his ability to communicate is limited.  Mother denies any alcohol use NSAID use or ingestion of foreign objects.  Upon further questioning mother also attests to a several week history of frequent loose stools.  Patient continued to exhibit  extremely poor oral intake over the past several weeks with an approximate 35 pound weight loss.    Assessment and Plan: * Acute upper GI bleed Question of possible hematemesis several weeks ago and now patient is presenting with 2 bowel movements comprised of dark red blood with clots Presence of several week history of odynophasia and poor oral intake suggest that this is likely upper GI in origin  Patient is a limited ability to provide a history due to being minimally verbal due to autism Initiating intravenous PPI every 12 hours Dr. Elnoria Howard to see Serial CBCs We will transfuse if hemoglobin drops less than 7  Sepsis due to colitis Chest x-ray unremarkable Appears to be tender on exam per mother (grabbed my hand when I was pressing which she says is a sign of pain  COVID-19 testing negative, blood cultures pending, procalcitonin negative, CRP elevated, urinalysis and CT imaging of the chest abdomen and pelvis: Colitis of the descending colon Continue cipro/flagyl only for now C diff and GI pathogen panel  Lactic acidosis Lactic acidosis likely secondary to degree of volume depletion in the setting of poor oral intake  Resolved with IVF  Odynophagia Best way to approach evaluating this ongoing problem is to proceed with a EGD  GI consult- Elnoria Howard  Autistic disorder Minimally verbal Minimizing uncomfortable stimuli Encouraging family to remain at bedside is much as possible          DVT prophylaxis: SCDs Start: 10/24/21 2235    Code Status: Full Code Family Communication:   Disposition Plan:  Level of care: Progressive Status is: Observation The patient will  require care spanning > 2 midnights and should be moved to inpatient because: needs EGD and abx    Consultants:  GI   Subjective: Appears to have abdominal pain  Objective: Vitals:   10/24/21 2145 10/24/21 2220 10/24/21 2350 10/25/21 0507  BP: 122/82 126/89 127/82 (!) 136/93  Pulse: (!) 114 (!) 127 (!)  125 (!) 105  Resp: (!) 25 (!) 22 20 20   Temp:  98.8 F (37.1 C) 97.8 F (36.6 C) 97.7 F (36.5 C)  TempSrc:  Oral Oral Oral  SpO2: 100%  100% 100%  Weight:    75.3 kg  Height:        Intake/Output Summary (Last 24 hours) at 10/25/2021 1130 Last data filed at 10/25/2021 0600 Gross per 24 hour  Intake 3351.59 ml  Output 1102 ml  Net 2249.59 ml   Filed Weights   10/24/21 1642 10/25/21 0507  Weight: 72.6 kg 75.3 kg    Examination:   General: Appearance:     Overweight male in no acute distress     Lungs:     respirations unlabored  Heart:    Tachycardic.    MS:   All extremities are intact.    Neurologic:   Awake, alert, non-verbal       Data Reviewed: I have personally reviewed following labs and imaging studies  CBC: Recent Labs  Lab 10/24/21 1743 10/25/21 0342 10/25/21 1026  WBC 8.2 7.9 6.9  NEUTROABS 5.0  --   --   HGB 11.6* 9.5* 9.2*  HCT 37.4* 29.7* 28.4*  MCV 77.3* 74.3* 73.8*  PLT 538* 420* 123XX123*   Basic Metabolic Panel: Recent Labs  Lab 10/24/21 1743 10/25/21 0342  NA 136 135  K 3.7 3.5  CL 97* 104  CO2 23 24  GLUCOSE 105* 125*  BUN 5* 6  CREATININE 0.79 0.77  CALCIUM 9.0 8.3*  MG  --  1.7   GFR: Estimated Creatinine Clearance: 109.7 mL/min (by C-G formula based on SCr of 0.77 mg/dL). Liver Function Tests: Recent Labs  Lab 10/24/21 1743 10/25/21 0342  AST 21 9*  ALT 12 9  ALKPHOS 66 53  BILITOT 0.6 0.9  PROT 7.7 6.2*  ALBUMIN 2.5* 2.0*   No results for input(s): "LIPASE", "AMYLASE" in the last 168 hours. No results for input(s): "AMMONIA" in the last 168 hours. Coagulation Profile: Recent Labs  Lab 10/24/21 1743  INR 1.4*   Cardiac Enzymes: No results for input(s): "CKTOTAL", "CKMB", "CKMBINDEX", "TROPONINI" in the last 168 hours. BNP (last 3 results) No results for input(s): "PROBNP" in the last 8760 hours. HbA1C: No results for input(s): "HGBA1C" in the last 72 hours. CBG: No results for input(s): "GLUCAP" in the  last 168 hours. Lipid Profile: No results for input(s): "CHOL", "HDL", "LDLCALC", "TRIG", "CHOLHDL", "LDLDIRECT" in the last 72 hours. Thyroid Function Tests: No results for input(s): "TSH", "T4TOTAL", "FREET4", "T3FREE", "THYROIDAB" in the last 72 hours. Anemia Panel: No results for input(s): "VITAMINB12", "FOLATE", "FERRITIN", "TIBC", "IRON", "RETICCTPCT" in the last 72 hours. Sepsis Labs: Recent Labs  Lab 10/24/21 1743 10/24/21 1819 10/24/21 2058  PROCALCITON <0.10  --   --   LATICACIDVEN  --  3.0* 1.4    Recent Results (from the past 240 hour(s))  Culture, blood (Routine x 2)     Status: None (Preliminary result)   Collection Time: 10/24/21  5:23 PM   Specimen: BLOOD  Result Value Ref Range Status   Specimen Description BLOOD BLOOD LEFT HAND  Final  Special Requests   Final    BOTTLES DRAWN AEROBIC AND ANAEROBIC Blood Culture results may not be optimal due to an inadequate volume of blood received in culture bottles   Culture   Final    NO GROWTH < 24 HOURS Performed at [facility], [address] [place], [place] [address]    Report Status PENDING  Incomplete  Culture, blood (Routine x 2)     Status: None (Preliminary result)   Collection Time: 10/24/21  6:19 PM   Specimen: BLOOD  Result Value Ref Range Status   Specimen Description BLOOD SITE NOT SPECIFIED  Final   Special Requests   Final    BOTTLES DRAWN AEROBIC AND ANAEROBIC Blood Culture adequate volume   Culture   Final    NO GROWTH < 24 HOURS Performed at [facility], [address], [place], [place] [address]    Report Status PENDING  Incomplete  SARS Coronavirus 2 by RT PCR (hospital order, performed in [facility] hospital lab) *cepheid single result test* Anterior Nasal Swab     Status: None   Collection Time: 10/24/21  8:21 PM   Specimen: Anterior Nasal Swab  Result Value Ref Range Status   SARS Coronavirus 2 by RT PCR NEGATIVE NEGATIVE Final    Comment: (NOTE) SARS-CoV-2 target nucleic  acids are NOT DETECTED.  The SARS-CoV-2 RNA is generally detectable in upper and lower respiratory specimens during the acute phase of infection. The lowest concentration of SARS-CoV-2 viral copies this assay can detect is 250 copies / mL. A negative result does not preclude SARS-CoV-2 infection and should not be used as the sole basis for treatment or other patient management decisions.  A negative result may occur with improper specimen collection / handling, submission of specimen other than nasopharyngeal swab, presence of viral mutation(s) within the areas targeted by this assay, and inadequate number of viral copies (<250 copies / mL). A negative result must be combined with clinical observations, patient history, and epidemiological information.  Fact Sheet for Patients:   RoadLapTop.co.za  Fact Sheet for Healthcare Providers: http://kim-miller.com/  This test is not yet approved or  cleared by the [place] FDA and has been authorized for detection and/or diagnosis of SARS-CoV-2 by FDA under an Emergency Use Authorization (EUA).  This EUA will remain in effect (meaning this test can be used) for the duration of the COVID-19 declaration under Section 564(b)(1) of the Act, 21 U.S.C. section 360bbb-3(b)(1), unless the authorization is terminated or revoked sooner.  Performed at [facility], [address] [place], [place] [address]          Radiology Studies: CT ABDOMEN PELVIS W CONTRAST  Result Date: 10/25/2021 CLINICAL DATA:  Abdominal pain. EXAM: CT ABDOMEN AND PELVIS WITH CONTRAST TECHNIQUE: Multidetector CT imaging of the abdomen and pelvis was performed using the standard protocol following bolus administration of intravenous contrast. RADIATION DOSE REDUCTION: This exam was performed according to the departmental dose-optimization program which includes automated exposure control, adjustment of the mA and/or  kV according to patient size and/or use of iterative reconstruction technique. CONTRAST:  OMNIPAQUE IOHEXOL 300 MG/ML  SOLN COMPARISON:  CT abdomen pelvis dated 10/07/2021. FINDINGS: Lower chest: The visualized lung bases are clear. No intra-abdominal free air or free fluid. Hepatobiliary: No focal liver abnormality is seen. No gallstones, gallbladder wall thickening, or biliary dilatation. Pancreas: Unremarkable. No pancreatic ductal dilatation or surrounding inflammatory changes. Spleen: Normal in size without focal abnormality. Adrenals/Urinary  Tract: Adrenal glands are unremarkable. Kidneys are normal, without renal calculi, focal lesion, or hydronephrosis. Bladder is unremarkable. Stomach/Bowel: There is inflammatory changes and thickening of the descending colon consistent with colitis. Clinical correlation is recommended. There is no bowel obstruction. The appendix is normal. Vascular/Lymphatic: The abdominal aorta and IVC are unremarkable. No portal venous gas. There is no adenopathy. Reproductive: The prostate and seminal vesicles are grossly unremarkable. No pelvic mass. Other: None Musculoskeletal: Bilateral L5 pars defects. No listhesis. No acute osseous pathology. IMPRESSION: Colitis of the descending colon. No bowel obstruction. Normal appendix. Electronically Signed   By: Anner Crete M.D.   On: 10/25/2021 00:51   DG Chest Port 1 View  Result Date: 10/24/2021 CLINICAL DATA:  Weakness EXAM: PORTABLE CHEST 1 VIEW COMPARISON:  09/14/2021 FINDINGS: Heart and mediastinal contours are within normal limits. No focal opacities or effusions. No acute bony abnormality. IMPRESSION: No active disease. Electronically Signed   By: Rolm Baptise M.D.   On: 10/24/2021 19:49        Scheduled Meds:   morphine injection  4 mg Intravenous Once   pantoprazole (PROTONIX) IV  40 mg Intravenous Q12H   sodium chloride flush  3 mL Intravenous Q12H   Continuous Infusions:  ciprofloxacin 400 mg (10/25/21  1044)   lactated ringers 125 mL/hr at 10/25/21 0926   metronidazole 500 mg (10/25/21 0923)     LOS: 0 days    Time spent: 45 minutes spent on chart review, discussion with nursing staff, consultants, updating family and interview/physical exam; more than 50% of that time was spent in counseling and/or coordination of care.    Geradine Girt, DO [facility] Available via Epic secure chat 7am-7pm After these hours, please refer to coverage provider listed on [email or web address] 10/25/2021, 11:30 AM

## 2021-10-25 NOTE — Progress Notes (Signed)
   10/25/21 2307  Assess: MEWS Score  Temp (!) 102.5 F (39.2 C)  BP 136/80  Pulse Rate (!) 122  Resp 16  SpO2 94 %  O2 Device Room Air  Assess: MEWS Score  MEWS Temp 2  MEWS Systolic 0  MEWS Pulse 2  MEWS RR 0  MEWS LOC 0  MEWS Score 4  MEWS Score Color Red  Assess: if the MEWS score is Yellow or Red  Were vital signs taken at a resting state? Yes  Focused Assessment Change from prior assessment (see assessment flowsheet)  Does the patient meet 2 or more of the SIRS criteria? Yes  Does the patient have a confirmed or suspected source of infection? No (does have a abcess on his buttocks and is on IV antibiotics)  MEWS guidelines implemented *See Row Information* No, other (Comment) (temp went up, on IV antibiotics, Tylenol given)  Document  Patient Outcome Stabilized after interventions  Progress note created (see row info) Yes  Assess: SIRS CRITERIA  SIRS Temperature  1  SIRS Pulse 1  SIRS Respirations  0  SIRS WBC 0  SIRS Score Sum  2

## 2021-10-25 NOTE — Progress Notes (Signed)
Patient bladder scanned shows greater then 470 ml, MD made aware, gave orders to in and out cath patient., drained out 1400 ml clear yellow urine. Patient was soiled in groin area, patient was turned, and noted an abscess with  foul smelling, pus like bloody drainage, upon further assessment an opening noted to inside of right buttocks an inch away from rectum.  Mother aware aware of all the above and per mother the patient has not voided at home in 3 to 4 days, and has not been to a urologist.  She also agreed that she has been smelling the foul odor at home but did not know where it was from. Patient relieved after draining urine. MD made aware of above.

## 2021-10-25 NOTE — Consult Note (Signed)
Reason for Consult: Dysphagia, Weight loss, Hematochezia, and Anemia Referring Physician: [facility]  Launa Flight HPI: This is a 41 year old male with a PMH of autism admitted for hematochezia.  The history was obtained from his mother who reports the onset of his symptoms 6 weeks ago.  At that times she noticed that his PO intake was difficult with solid food, however, he was able to drink liquids without any problems.  His weight dropped from 205 lbs down to 170-715 lbs, per his mother.  There were no reports of nausea or vomiting, but he did have abdominal pain with palpation of the right side.  Recently he was in the [facility] ER for an episode of hematemesis and work up at that time was unrevealing.  He was evaluated by GI in [place] and he was to have an EGD on 10/28/2021, but he had hematochezia.  His mother reports that she saw a pool of blood with clots the day before yesterday.  Upon returning home yesterday after her work she saw some blood and possible hematemesis.  His HGB dropped down from 12.5 g/dL to 9.2 g/dL, but he is hemodynamically stable.  Past Medical History:  Diagnosis Date   Autism     History reviewed. No pertinent surgical history.  Family History  Problem Relation Age of Onset   Diabetes Father     Social History:  reports that he has never smoked. He has never used smokeless tobacco. He reports that he does not drink alcohol and does not use drugs.  Allergies: No Known Allergies  Medications: Scheduled:   morphine injection  4 mg Intravenous Once   pantoprazole (PROTONIX) IV  40 mg Intravenous Q12H   polyethylene glycol-electrolytes  4,000 mL Oral Once   sodium chloride flush  3 mL Intravenous Q12H   Continuous:  ciprofloxacin 400 mg (10/25/21 1044)   lactated ringers 125 mL/hr at 10/25/21 0926   metronidazole 500 mg (10/25/21 0923)    Results for orders placed or performed during the hospital encounter of 10/24/21 (from the past 24 hour(s))   Culture, blood (Routine x 2)     Status: None (Preliminary result)   Collection Time: 10/24/21  5:23 PM   Specimen: BLOOD  Result Value Ref Range   Specimen Description BLOOD BLOOD LEFT HAND    Special Requests      BOTTLES DRAWN AEROBIC AND ANAEROBIC Blood Culture results may not be optimal due to an inadequate volume of blood received in culture bottles   Culture      NO GROWTH < 24 HOURS Performed at [facility], [address], [place], [place] [address]    Report Status PENDING   Comprehensive metabolic panel     Status: Abnormal   Collection Time: 10/24/21  5:43 PM  Result Value Ref Range   Sodium 136 135 - 145 mmol/L   Potassium 3.7 3.5 - 5.1 mmol/L   Chloride 97 (L) 98 - 111 mmol/L   CO2 23 22 - 32 mmol/L   Glucose, Bld 105 (H) 70 - 99 mg/dL   BUN 5 (L) 6 - 20 mg/dL   Creatinine, Ser 0.09 0.61 - 1.24 mg/dL   Calcium 9.0 8.9 - 38.1 mg/dL   Total Protein 7.7 6.5 - 8.1 g/dL   Albumin 2.5 (L) 3.5 - 5.0 g/dL   AST 21 15 - 41 U/L   ALT 12 0 - 44 U/L   Alkaline Phosphatase 66 38 - 126 U/L  Total Bilirubin 0.6 0.3 - 1.2 mg/dL   GFR, Estimated >76 >28 mL/min   Anion gap 16 (H) 5 - 15  CBC with Differential     Status: Abnormal   Collection Time: 10/24/21  5:43 PM  Result Value Ref Range   WBC 8.2 4.0 - 10.5 K/uL   RBC 4.84 4.22 - 5.81 MIL/uL   Hemoglobin 11.6 (L) 13.0 - 17.0 g/dL   HCT 31.5 (L) 17.6 - 16.0 %   MCV 77.3 (L) 80.0 - 100.0 fL   MCH 24.0 (L) 26.0 - 34.0 pg   MCHC 31.0 30.0 - 36.0 g/dL   RDW 73.7 10.6 - 26.9 %   Platelets 538 (H) 150 - 400 K/uL   nRBC 0.0 0.0 - 0.2 %   Neutrophils Relative % 61 %   Neutro Abs 5.0 1.7 - 7.7 K/uL   Lymphocytes Relative 16 %   Lymphs Abs 1.4 0.7 - 4.0 K/uL   Monocytes Relative 19 %   Monocytes Absolute 1.6 (H) 0.1 - 1.0 K/uL   Eosinophils Relative 2 %   Eosinophils Absolute 0.2 0.0 - 0.5 K/uL   Basophils Relative 1 %   Basophils Absolute 0.0 0.0 - 0.1 K/uL   Immature Granulocytes 1 %   Abs Immature Granulocytes  0.04 0.00 - 0.07 K/uL  Protime-INR     Status: Abnormal   Collection Time: 10/24/21  5:43 PM  Result Value Ref Range   Prothrombin Time 17.1 (H) 11.4 - 15.2 seconds   INR 1.4 (H) 0.8 - 1.2  Type and screen [facility]     Status: None   Collection Time: 10/24/21  5:43 PM  Result Value Ref Range   ABO/RH(D) O POS    Antibody Screen NEG    Sample Expiration      10/27/2021,2359 Performed at [facility], [address], [place], [place] [address]   Procalcitonin     Status: None   Collection Time: 10/24/21  5:43 PM  Result Value Ref Range   Procalcitonin <0.10 ng/mL  Lactic acid, plasma     Status: Abnormal   Collection Time: 10/24/21  6:19 PM  Result Value Ref Range   Lactic Acid, Venous 3.0 (HH) 0.5 - 1.9 mmol/L  Culture, blood (Routine x 2)     Status: None (Preliminary result)   Collection Time: 10/24/21  6:19 PM   Specimen: BLOOD  Result Value Ref Range   Specimen Description BLOOD SITE NOT SPECIFIED    Special Requests      BOTTLES DRAWN AEROBIC AND ANAEROBIC Blood Culture adequate volume   Culture      NO GROWTH < 24 HOURS Performed at [facility], [address] [place], [place] [address]    Report Status PENDING   POC occult blood, ED     Status: Abnormal   Collection Time: 10/24/21  6:25 PM  Result Value Ref Range   Fecal Occult Bld POSITIVE (A) NEGATIVE  SARS Coronavirus 2 by RT PCR (hospital order, performed in [facility] hospital lab) *cepheid single result test* Anterior Nasal Swab     Status: None   Collection Time: 10/24/21  8:21 PM   Specimen: Anterior Nasal Swab  Result Value Ref Range   SARS Coronavirus 2 by RT PCR NEGATIVE NEGATIVE  Lactic acid, plasma     Status: None   Collection Time: 10/24/21  8:58 PM  Result Value Ref Range   Lactic Acid, Venous 1.4 0.5 - 1.9 mmol/L  C-reactive protein     Status: Abnormal   Collection Time: 10/24/21  8:58 PM  Result Value Ref Range   CRP 20.0 (H) <1.0 mg/dL  Urinalysis,  Routine w reflex microscopic     Status: Abnormal   Collection Time: 10/25/21  3:20 AM  Result Value Ref Range   Color, Urine YELLOW YELLOW   APPearance CLEAR CLEAR   Specific Gravity, Urine 1.036 (H) 1.005 - 1.030   pH 5.0 5.0 - 8.0   Glucose, UA NEGATIVE NEGATIVE mg/dL   Hgb urine dipstick NEGATIVE NEGATIVE   Bilirubin Urine NEGATIVE NEGATIVE   Ketones, ur 5 (A) NEGATIVE mg/dL   Protein, ur NEGATIVE NEGATIVE mg/dL   Nitrite NEGATIVE NEGATIVE   Leukocytes,Ua NEGATIVE NEGATIVE  HIV Antibody (routine testing w rflx)     Status: None   Collection Time: 10/25/21  3:42 AM  Result Value Ref Range   HIV Screen 4th Generation wRfx Non Reactive Non Reactive  Comprehensive metabolic panel     Status: Abnormal   Collection Time: 10/25/21  3:42 AM  Result Value Ref Range   Sodium 135 135 - 145 mmol/L   Potassium 3.5 3.5 - 5.1 mmol/L   Chloride 104 98 - 111 mmol/L   CO2 24 22 - 32 mmol/L   Glucose, Bld 125 (H) 70 - 99 mg/dL   BUN 6 6 - 20 mg/dL   Creatinine, Ser 9.51 0.61 - 1.24 mg/dL   Calcium 8.3 (L) 8.9 - 10.3 mg/dL   Total Protein 6.2 (L) 6.5 - 8.1 g/dL   Albumin 2.0 (L) 3.5 - 5.0 g/dL   AST 9 (L) 15 - 41 U/L   ALT 9 0 - 44 U/L   Alkaline Phosphatase 53 38 - 126 U/L   Total Bilirubin 0.9 0.3 - 1.2 mg/dL   GFR, Estimated >88 >41 mL/min   Anion gap 7 5 - 15  Magnesium     Status: None   Collection Time: 10/25/21  3:42 AM  Result Value Ref Range   Magnesium 1.7 1.7 - 2.4 mg/dL  CBC     Status: Abnormal   Collection Time: 10/25/21  3:42 AM  Result Value Ref Range   WBC 7.9 4.0 - 10.5 K/uL   RBC 4.00 (L) 4.22 - 5.81 MIL/uL   Hemoglobin 9.5 (L) 13.0 - 17.0 g/dL   HCT 66.0 (L) 63.0 - 16.0 %   MCV 74.3 (L) 80.0 - 100.0 fL   MCH 23.8 (L) 26.0 - 34.0 pg   MCHC 32.0 30.0 - 36.0 g/dL   RDW 10.9 32.3 - 55.7 %   Platelets 420 (H) 150 - 400 K/uL   nRBC 0.0 0.0 - 0.2 %  CBC     Status: Abnormal   Collection Time: 10/25/21 10:26 AM  Result Value Ref Range   WBC 6.9 4.0 - 10.5 K/uL    RBC 3.85 (L) 4.22 - 5.81 MIL/uL   Hemoglobin 9.2 (L) 13.0 - 17.0 g/dL   HCT 32.2 (L) 02.5 - 42.7 %   MCV 73.8 (L) 80.0 - 100.0 fL   MCH 23.9 (L) 26.0 - 34.0 pg   MCHC 32.4 30.0 - 36.0 g/dL   RDW 06.2 37.6 - 28.3 %   Platelets 401 (H) 150 - 400 K/uL   nRBC 0.0 0.0 - 0.2 %     CT ABDOMEN PELVIS W CONTRAST  Result Date: 10/25/2021 CLINICAL DATA:  Abdominal pain. EXAM: CT ABDOMEN AND PELVIS WITH CONTRAST TECHNIQUE: Multidetector CT imaging of the abdomen and  pelvis was performed using the standard protocol following bolus administration of intravenous contrast. RADIATION DOSE REDUCTION: This exam was performed according to the departmental dose-optimization program which includes automated exposure control, adjustment of the mA and/or kV according to patient size and/or use of iterative reconstruction technique. CONTRAST:  OMNIPAQUE IOHEXOL 300 MG/ML  SOLN COMPARISON:  CT abdomen pelvis dated 10/07/2021. FINDINGS: Lower chest: The visualized lung bases are clear. No intra-abdominal free air or free fluid. Hepatobiliary: No focal liver abnormality is seen. No gallstones, gallbladder wall thickening, or biliary dilatation. Pancreas: Unremarkable. No pancreatic ductal dilatation or surrounding inflammatory changes. Spleen: Normal in size without focal abnormality. Adrenals/Urinary Tract: Adrenal glands are unremarkable. Kidneys are normal, without renal calculi, focal lesion, or hydronephrosis. Bladder is unremarkable. Stomach/Bowel: There is inflammatory changes and thickening of the descending colon consistent with colitis. Clinical correlation is recommended. There is no bowel obstruction. The appendix is normal. Vascular/Lymphatic: The abdominal aorta and IVC are unremarkable. No portal venous gas. There is no adenopathy. Reproductive: The prostate and seminal vesicles are grossly unremarkable. No pelvic mass. Other: None Musculoskeletal: Bilateral L5 pars defects. No listhesis. No acute osseous  pathology. IMPRESSION: Colitis of the descending colon. No bowel obstruction. Normal appendix. Electronically Signed   By: Elgie Collard M.D.   On: 10/25/2021 00:51   DG Chest Port 1 View  Result Date: 10/24/2021 CLINICAL DATA:  Weakness EXAM: PORTABLE CHEST 1 VIEW COMPARISON:  09/14/2021 FINDINGS: Heart and mediastinal contours are within normal limits. No focal opacities or effusions. No acute bony abnormality. IMPRESSION: No active disease. Electronically Signed   By: Charlett Nose M.D.   On: 10/24/2021 19:49    ROS:  As stated above in the HPI otherwise negative.  Blood pressure (!) 136/93, pulse (!) 105, temperature 97.7 F (36.5 C), temperature source Oral, resp. rate 20, height 5\' 6"  (1.676 m), weight 75.3 kg, SpO2 100 %.    PE: Gen: NAD, Alert and Oriented HEENT:  [place]/AT, EOMI Neck: Supple, no LAD Lungs: CTA Bilaterally CV: RRR without M/G/R ABD: Soft, NTND, +BS Ext: No C/C/E  Assessment/Plan: 1) Hematochezia. 2) Recent history of hematemesis. 3) Anemia. 4) Dysphagia - solid food. 5) Weight loss.   Nursing reported that the stool she collected for his loose stools did show a little blood.  He is stable hemodynamically.  Further evaluation is necessary with an EGD/colonoscopy.  Plan: 1) EGD/colonoscopy tomorrow. 2) Follow HGB and transfuse as necessary.  [place] Andy D 10/25/2021, 12:49 PM

## 2021-10-26 DIAGNOSIS — K922 Gastrointestinal hemorrhage, unspecified: Secondary | ICD-10-CM | POA: Diagnosis not present

## 2021-10-26 DIAGNOSIS — R338 Other retention of urine: Secondary | ICD-10-CM

## 2021-10-26 LAB — GASTROINTESTINAL PANEL BY PCR, STOOL (REPLACES STOOL CULTURE)

## 2021-10-26 LAB — CBC
HCT: 29.9 % — ABNORMAL LOW (ref 39.0–52.0)
Hemoglobin: 9.7 g/dL — ABNORMAL LOW (ref 13.0–17.0)
MCH: 23.7 pg — ABNORMAL LOW (ref 26.0–34.0)
MCHC: 32.4 g/dL (ref 30.0–36.0)
MCV: 72.9 fL — ABNORMAL LOW (ref 80.0–100.0)
Platelets: 377 10*3/uL (ref 150–400)
RBC: 4.1 MIL/uL — ABNORMAL LOW (ref 4.22–5.81)
RDW: 15.1 % (ref 11.5–15.5)
WBC: 6.1 10*3/uL (ref 4.0–10.5)
nRBC: 0 % (ref 0.0–0.2)

## 2021-10-26 LAB — GLUCOSE, CAPILLARY
Glucose-Capillary: 112 mg/dL — ABNORMAL HIGH (ref 70–99)
Glucose-Capillary: 114 mg/dL — ABNORMAL HIGH (ref 70–99)
Glucose-Capillary: 111 mg/dL — ABNORMAL HIGH (ref 70–99)
Glucose-Capillary: 99 mg/dL (ref 70–99)
Glucose-Capillary: 120 mg/dL — ABNORMAL HIGH (ref 70–99)

## 2021-10-26 LAB — BASIC METABOLIC PANEL
Anion gap: 14 (ref 5–15)
BUN: <5 mg/dL — ABNORMAL LOW (ref 6–20)
CO2: 25 mmol/L (ref 22–32)
Calcium: 8.4 mg/dL — ABNORMAL LOW (ref 8.9–10.3)
Chloride: 99 mmol/L (ref 98–111)
Creatinine, Ser: 0.69 mg/dL (ref 0.61–1.24)
GFR, Estimated: >60 mL/min (ref >60)
Glucose, Bld: 114 mg/dL — ABNORMAL HIGH (ref 70–99)
Potassium: 3.7 mmol/L (ref 3.5–5.1)
Sodium: 138 mmol/L (ref 135–145)

## 2021-10-26 MED ORDER — LACTATED RINGERS IV SOLN: INTRAVENOUS | Status: AC

## 2021-10-26 MED ORDER — CHLORHEXIDINE GLUCONATE CLOTH 2 % EX PADS
6.0000 | MEDICATED_PAD | Freq: Every day | CUTANEOUS | Status: DC
Start: 2021-10-26 — End: 2021-10-29
  Administered 2021-10-26 – 2021-10-29 (×4): 6 via TOPICAL

## 2021-10-26 NOTE — Progress Notes (Signed)
Foley catheter ordered on 10/26/2021 for acute urinary retention, per protocol.

## 2021-10-26 NOTE — Progress Notes (Signed)
   10/25/21 2048  Assess: MEWS Score  Temp 99.4 F (37.4 C)  BP (!) 152/90  MAP (mmHg) 106  Pulse Rate (!) 120  SpO2 96 %  O2 Device Room Air  Assess: MEWS Score  MEWS Temp 0  MEWS Systolic 0  MEWS Pulse 2  MEWS RR 0  MEWS LOC 0  MEWS Score 2  MEWS Score Color Yellow  Assess: if the MEWS score is Yellow or Red  Were vital signs taken at a resting state? Yes  Focused Assessment No change from prior assessment  Does the patient meet 2 or more of the SIRS criteria? Yes  Does the patient have a confirmed or suspected source of infection? No (pt does have a abcess on his buttocks and is on IV antibiotics)  MEWS guidelines implemented *See Row Information* No, previously yellow, continue vital signs every 4 hours  Document  Patient Outcome Stabilized after interventions  Progress note created (see row info) Yes  Assess: SIRS CRITERIA  SIRS Temperature  0  SIRS Pulse 1  SIRS Respirations  0  SIRS WBC 0  SIRS Score Sum  1

## 2021-10-26 NOTE — Progress Notes (Signed)
  Transition of Care [facility] [place] [facility]) Screening Note   Patient Details  Name: Donald Gallagher Date of Birth: [birth year]   Transition of Care [facility]) CM/SW Contact:    Patrice Paradise, LCSW Phone Number: 10/26/2021, 11:03 AM    Transition of Care Department [facility]) has reviewed patient and no TOC needs have been identified at this time. We will continue to monitor patient advancement through interdisciplinary progression rounds. If new patient transition needs arise, please place a TOC consult.

## 2021-10-26 NOTE — Progress Notes (Signed)
Subjective: No acute events.  Objective: Vital signs in last 24 hours: Temp:  [98.5 F (36.9 C)-102.5 F (39.2 C)] 98.6 F (37 C) (07/16 0737) Pulse Rate:  [105-122] 105 (07/16 0739) Resp:  [16-20] 20 (07/16 0739) BP: (134-152)/(80-100) 137/90 (07/16 0739) SpO2:  [94 %-97 %] 96 % (07/16 0739) Weight:  [77.6 kg] 77.6 kg (07/16 0424) Last BM Date : 10/26/21  Intake/Output from previous day: 07/15 0701 - 07/16 0700 In: 880 [P.O.:880] Out: 3608 [Urine:3608] Intake/Output this shift: Total I/O In: -  Out: 150 [Urine:150]  General appearance: alert and no distress GI: soft, non-tender; bowel sounds normal; no masses,  no organomegaly  Lab Results: Recent Labs    10/25/21 0342 10/25/21 1026 10/26/21 0800  WBC 7.9 6.9 6.1  HGB 9.5* 9.2* 9.7*  HCT 29.7* 28.4* 29.9*  PLT 420* 401* 377   BMET Recent Labs    10/24/21 1743 10/25/21 0342 10/26/21 0800  NA 136 135 138  K 3.7 3.5 3.7  CL 97* 104 99  CO2 23 24 25   GLUCOSE 105* 125* 114*  BUN 5* 6 <5*  CREATININE 0.79 0.77 0.69  CALCIUM 9.0 8.3* 8.4*   LFT Recent Labs    10/25/21 0342  PROT 6.2*  ALBUMIN 2.0*  AST 9*  ALT 9  ALKPHOS 53  BILITOT 0.9   PT/INR Recent Labs    10/24/21 1743  LABPROT 17.1*  INR 1.4*   Hepatitis Panel No results for input(s): "HEPBSAG", "HCVAB", "HEPAIGM", "HEPBIGM" in the last 72 hours. C-Diff Recent Labs    10/25/21 0742  CDIFFTOX NEGATIVE   Fecal Lactopherrin No results for input(s): "FECLLACTOFRN" in the last 72 hours.  Studies/Results: CT ABDOMEN PELVIS W CONTRAST  Result Date: 10/25/2021 CLINICAL DATA:  Abdominal pain. EXAM: CT ABDOMEN AND PELVIS WITH CONTRAST TECHNIQUE: Multidetector CT imaging of the abdomen and pelvis was performed using the standard protocol following bolus administration of intravenous contrast. RADIATION DOSE REDUCTION: This exam was performed according to the departmental dose-optimization program which includes automated exposure control,  adjustment of the mA and/or kV according to patient size and/or use of iterative reconstruction technique. CONTRAST:  10/27/2021 OMNIPAQUE IOHEXOL 300 MG/ML  SOLN COMPARISON:  CT abdomen pelvis dated 10/07/2021. FINDINGS: Lower chest: The visualized lung bases are clear. No intra-abdominal free air or free fluid. Hepatobiliary: No focal liver abnormality is seen. No gallstones, gallbladder wall thickening, or biliary dilatation. Pancreas: Unremarkable. No pancreatic ductal dilatation or surrounding inflammatory changes. Spleen: Normal in size without focal abnormality. Adrenals/Urinary Tract: Adrenal glands are unremarkable. Kidneys are normal, without renal calculi, focal lesion, or hydronephrosis. Bladder is unremarkable. Stomach/Bowel: There is inflammatory changes and thickening of the descending colon consistent with colitis. Clinical correlation is recommended. There is no bowel obstruction. The appendix is normal. Vascular/Lymphatic: The abdominal aorta and IVC are unremarkable. No portal venous gas. There is no adenopathy. Reproductive: The prostate and seminal vesicles are grossly unremarkable. No pelvic mass. Other: None Musculoskeletal: Bilateral L5 pars defects. No listhesis. No acute osseous pathology. IMPRESSION: Colitis of the descending colon. No bowel obstruction. Normal appendix. Electronically Signed   By: 10/09/2021 M.D.   On: 10/25/2021 00:51   DG Chest Port 1 View  Result Date: 10/24/2021 CLINICAL DATA:  Weakness EXAM: PORTABLE CHEST 1 VIEW COMPARISON:  09/14/2021 FINDINGS: Heart and mediastinal contours are within normal limits. No focal opacities or effusions. No acute bony abnormality. IMPRESSION: No active disease. Electronically Signed   By: 11/14/2021 M.D.   On: 10/24/2021 19:49  Medications: Scheduled:  Chlorhexidine Gluconate Cloth  6 each Topical Daily   pantoprazole (PROTONIX) IV  40 mg Intravenous Q12H   sodium chloride flush  3 mL Intravenous Q12H   Continuous:   ciprofloxacin 400 mg (10/26/21 1015)   lactated ringers 75 mL/hr at 10/26/21 1006   metronidazole Stopped (10/26/21 0733)    Assessment/Plan: 1) Dysphagia. 2) Hematochezia. 3) Anemia. 4) Perirectal abscess.   The patient only drank half of the prep.  His father states that he will help him drink the other half later this afternoon.  Yesterday afternoon he was noted to have purulent drainage from the rectum and there is suspicion of a perirectal abscess.  His HGB is stable in the 9 g/dL range.  Plan: 1) Reprep for the colonoscopy tomorrow.  LOS: 1 day   Naimah Yingst D 10/26/2021, 10:53 AM  

## 2021-10-26 NOTE — H&P (View-Only) (Signed)
Subjective: No acute events.  Objective: Vital signs in last 24 hours: Temp:  [98.5 F (36.9 C)-102.5 F (39.2 C)] 98.6 F (37 C) (07/16 0737) Pulse Rate:  [105-122] 105 (07/16 0739) Resp:  [16-20] 20 (07/16 0739) BP: (134-152)/(80-100) 137/90 (07/16 0739) SpO2:  [94 %-97 %] 96 % (07/16 0739) Weight:  [77.6 kg] 77.6 kg (07/16 0424) Last BM Date : 10/26/21  Intake/Output from previous day: 07/15 0701 - 07/16 0700 In: 880 [P.O.:880] Out: 3608 [Urine:3608] Intake/Output this shift: Total I/O In: -  Out: 150 [Urine:150]  General appearance: alert and no distress GI: soft, non-tender; bowel sounds normal; no masses,  no organomegaly  Lab Results: Recent Labs    10/25/21 0342 10/25/21 1026 10/26/21 0800  WBC 7.9 6.9 6.1  HGB 9.5* 9.2* 9.7*  HCT 29.7* 28.4* 29.9*  PLT 420* 401* 377   BMET Recent Labs    10/24/21 1743 10/25/21 0342 10/26/21 0800  NA 136 135 138  K 3.7 3.5 3.7  CL 97* 104 99  CO2 23 24 25   GLUCOSE 105* 125* 114*  BUN 5* 6 <5*  CREATININE 0.79 0.77 0.69  CALCIUM 9.0 8.3* 8.4*   LFT Recent Labs    10/25/21 0342  PROT 6.2*  ALBUMIN 2.0*  AST 9*  ALT 9  ALKPHOS 53  BILITOT 0.9   PT/INR Recent Labs    10/24/21 1743  LABPROT 17.1*  INR 1.4*   Hepatitis Panel No results for input(s): "HEPBSAG", "HCVAB", "HEPAIGM", "HEPBIGM" in the last 72 hours. C-Diff Recent Labs    10/25/21 0742  CDIFFTOX NEGATIVE   Fecal Lactopherrin No results for input(s): "FECLLACTOFRN" in the last 72 hours.  Studies/Results: CT ABDOMEN PELVIS W CONTRAST  Result Date: 10/25/2021 CLINICAL DATA:  Abdominal pain. EXAM: CT ABDOMEN AND PELVIS WITH CONTRAST TECHNIQUE: Multidetector CT imaging of the abdomen and pelvis was performed using the standard protocol following bolus administration of intravenous contrast. RADIATION DOSE REDUCTION: This exam was performed according to the departmental dose-optimization program which includes automated exposure control,  adjustment of the mA and/or kV according to patient size and/or use of iterative reconstruction technique. CONTRAST:  10/27/2021 OMNIPAQUE IOHEXOL 300 MG/ML  SOLN COMPARISON:  CT abdomen pelvis dated 10/07/2021. FINDINGS: Lower chest: The visualized lung bases are clear. No intra-abdominal free air or free fluid. Hepatobiliary: No focal liver abnormality is seen. No gallstones, gallbladder wall thickening, or biliary dilatation. Pancreas: Unremarkable. No pancreatic ductal dilatation or surrounding inflammatory changes. Spleen: Normal in size without focal abnormality. Adrenals/Urinary Tract: Adrenal glands are unremarkable. Kidneys are normal, without renal calculi, focal lesion, or hydronephrosis. Bladder is unremarkable. Stomach/Bowel: There is inflammatory changes and thickening of the descending colon consistent with colitis. Clinical correlation is recommended. There is no bowel obstruction. The appendix is normal. Vascular/Lymphatic: The abdominal aorta and IVC are unremarkable. No portal venous gas. There is no adenopathy. Reproductive: The prostate and seminal vesicles are grossly unremarkable. No pelvic mass. Other: None Musculoskeletal: Bilateral L5 pars defects. No listhesis. No acute osseous pathology. IMPRESSION: Colitis of the descending colon. No bowel obstruction. Normal appendix. Electronically Signed   By: 10/09/2021 M.D.   On: 10/25/2021 00:51   DG Chest Port 1 View  Result Date: 10/24/2021 CLINICAL DATA:  Weakness EXAM: PORTABLE CHEST 1 VIEW COMPARISON:  09/14/2021 FINDINGS: Heart and mediastinal contours are within normal limits. No focal opacities or effusions. No acute bony abnormality. IMPRESSION: No active disease. Electronically Signed   By: 11/14/2021 M.D.   On: 10/24/2021 19:49  Medications: Scheduled:  Chlorhexidine Gluconate Cloth  6 each Topical Daily   pantoprazole (PROTONIX) IV  40 mg Intravenous Q12H   sodium chloride flush  3 mL Intravenous Q12H   Continuous:   ciprofloxacin 400 mg (10/26/21 1015)   lactated ringers 75 mL/hr at 10/26/21 1006   metronidazole Stopped (10/26/21 0733)    Assessment/Plan: 1) Dysphagia. 2) Hematochezia. 3) Anemia. 4) Perirectal abscess.   The patient only drank half of the prep.  His father states that he will help him drink the other half later this afternoon.  Yesterday afternoon he was noted to have purulent drainage from the rectum and there is suspicion of a perirectal abscess.  His HGB is stable in the 9 g/dL range.  Plan: 1) Reprep for the colonoscopy tomorrow.  LOS: 1 day   Donald Gallagher D 10/26/2021, 10:53 AM

## 2021-10-26 NOTE — Consult Note (Signed)
Consult Note  Donald Gallagher 13-Apr-1981  [id number].    Requesting MD: Marlin Canary, DO Chief Complaint/Reason for Consult: possible perirectal abscess HPI:  Patient is a 41 year old autistic male who is currently admitted after presenting with BRBPR. He has had a 2 month hx of cough and dysphagia that was being worked up as an outpatient and has had treatment with course of Augmentin for presumed pharyngitis. He presented to [facility] 6/27 with concern for hematemesis. At that time workup did not reveal anything warranting admission and he was discharged on a PPI with outpatient GI follow up and ENT follow up. He was planned for EGD with GI in [place] next week but patient has continued to have poor PO intake and 7/14 he had 2 bloody BMs. Patient's mother reported 2 bloody BMs with dark red blood and clots, she also reported intermittent abdominal pain although his verbal communication is limited. No NSAID use or foreign body ingestion. He has had several weeks of loose stools. ~35 lbs weight loss over the last several weeks. While admitted he was noted to have some purulent drainage from perirectal area as well, seems that started yesterday. He answers "yes" to having some rectal pain. Father is at bedside this AM.   Pt otherwise fairly healthy, no daily meds other than recently. NKDA. No blood thinners. No hx of perirectal abscess per patient's father.   ROS: Review of Systems  Unable to perform ROS: Mental acuity    Family History  Problem Relation Age of Onset   Diabetes Father     Past Medical History:  Diagnosis Date   Autism     History reviewed. No pertinent surgical history.  Social History:  reports that he has never smoked. He has never used smokeless tobacco. He reports that he does not drink alcohol and does not use drugs.  Allergies: No Known Allergies  Medications Prior to Admission  Medication Sig Dispense Refill   acetaminophen (TYLENOL) 500 MG tablet Take  1,000 mg by mouth every 6 (six) hours as needed for fever.     bismuth subsalicylate (PEPTO BISMOL) 262 MG/15ML suspension Take 30 mLs by mouth every 6 (six) hours as needed for indigestion or diarrhea or loose stools.     magic mouthwash (nystatin, diphenhydrAMINE, alum & mag hydroxide) suspension mixture Swish and swallow 5 mLs 4 (four) times daily. 480 mL 1   pantoprazole (PROTONIX) 40 MG tablet Take 1 tablet (40 mg total) by mouth daily. 30 tablet 0   amoxicillin-clavulanate (AUGMENTIN) 875-125 MG tablet Take 1 tablet by mouth 2 (two) times daily. (Patient not taking: Reported on 10/20/2021) 20 tablet 0    Blood pressure 137/90, pulse (!) 105, temperature 98.6 F (37 C), temperature source Oral, resp. rate 20, height 5\' 6"  (1.676 m), weight 77.6 kg, SpO2 96 %. Physical Exam:  General: pleasant, WD, WN male who is laying in bed in NAD HEENT: head is normocephalic, atraumatic.  Sclera are noninjected.  PERRL.  Ears and nose without any masses or lesions.  Mouth is pink and moist Heart: regular, rate, and rhythm.   Lungs:  Respiratory effort nonlabored Abd: soft, NT, ND, no masses, hernias, or organomegaly GU: purulent drainage noted with some liquid brown stool present, unable to fully assess due to patient tensing on exam  MS: all 4 extremities are symmetrical with no cyanosis, clubbing, or edema. Skin: warm and dry with no masses, lesions, or rashes Psych: child-like, answers simple questions  Results for orders placed or performed during the hospital encounter of 10/24/21 (from the past 48 hour(s))  Culture, blood (Routine x 2)     Status: None (Preliminary result)   Collection Time: 10/24/21  5:23 PM   Specimen: BLOOD  Result Value Ref Range   Specimen Description BLOOD BLOOD LEFT HAND    Special Requests      BOTTLES DRAWN AEROBIC AND ANAEROBIC Blood Culture results may not be optimal due to an inadequate volume of blood received in culture bottles   Culture      NO GROWTH < 24  HOURS Performed at [facility], [address] [place], [place] [address]    Report Status PENDING   Comprehensive metabolic panel     Status: Abnormal   Collection Time: 10/24/21  5:43 PM  Result Value Ref Range   Sodium 136 135 - 145 mmol/L   Potassium 3.7 3.5 - 5.1 mmol/L   Chloride 97 (L) 98 - 111 mmol/L   CO2 23 22 - 32 mmol/L   Glucose, Bld 105 (H) 70 - 99 mg/dL    Comment: Glucose reference range applies only to samples taken after fasting for at least 8 hours.   BUN 5 (L) 6 - 20 mg/dL   Creatinine, Ser 1.320.79 0.61 - 1.24 mg/dL   Calcium 9.0 8.9 - 44.010.3 mg/dL   Total Protein 7.7 6.5 - 8.1 g/dL   Albumin 2.5 (L) 3.5 - 5.0 g/dL   AST 21 15 - 41 U/L   ALT 12 0 - 44 U/L   Alkaline Phosphatase 66 38 - 126 U/L   Total Bilirubin 0.6 0.3 - 1.2 mg/dL   GFR, Estimated >10>60 >27>60 mL/min    Comment: (NOTE) Calculated using the CKD-EPI Creatinine Equation (2021)    Anion gap 16 (H) 5 - 15    Comment: Performed at [facility], [address] [place], [place] [address]  CBC with Differential     Status: Abnormal   Collection Time: 10/24/21  5:43 PM  Result Value Ref Range   WBC 8.2 4.0 - 10.5 K/uL   RBC 4.84 4.22 - 5.81 MIL/uL   Hemoglobin 11.6 (L) 13.0 - 17.0 g/dL   HCT 44.037.4 (L) 34.739.0 - 42.552.0 %   MCV 77.3 (L) 80.0 - 100.0 fL   MCH 24.0 (L) 26.0 - 34.0 pg   MCHC 31.0 30.0 - 36.0 g/dL   RDW 95.615.0 38.711.5 - 56.415.5 %   Platelets 538 (H) 150 - 400 K/uL   nRBC 0.0 0.0 - 0.2 %   Neutrophils Relative % 61 %   Neutro Abs 5.0 1.7 - 7.7 K/uL   Lymphocytes Relative 16 %   Lymphs Abs 1.4 0.7 - 4.0 K/uL   Monocytes Relative 19 %   Monocytes Absolute 1.6 (H) 0.1 - 1.0 K/uL   Eosinophils Relative 2 %   Eosinophils Absolute 0.2 0.0 - 0.5 K/uL   Basophils Relative 1 %   Basophils Absolute 0.0 0.0 - 0.1 K/uL   Immature Granulocytes 1 %   Abs Immature Granulocytes 0.04 0.00 - 0.07 K/uL    Comment: Performed at [facility], [address] [place], [place] [address]  Protime-INR      Status: Abnormal   Collection Time: 10/24/21  5:43 PM  Result Value Ref Range   Prothrombin Time 17.1 (H) 11.4 - 15.2 seconds   INR 1.4 (H) 0.8 - 1.2    Comment: (NOTE) INR goal varies based on  device and disease states. Performed at [facility], [address] [place], [place] [address]   Type and screen [facility]     Status: None   Collection Time: 10/24/21  5:43 PM  Result Value Ref Range   ABO/RH(D) O POS    Antibody Screen NEG    Sample Expiration      10/27/2021,2359 Performed at [facility], [address] [place], [place] [address]   Procalcitonin     Status: None   Collection Time: 10/24/21  5:43 PM  Result Value Ref Range   Procalcitonin <0.10 ng/mL    Comment:        Interpretation: PCT (Procalcitonin) <= 0.5 ng/mL: Systemic infection (sepsis) is not likely. Local bacterial infection is possible. (NOTE)       Sepsis PCT Algorithm           Lower Respiratory Tract                                      Infection PCT Algorithm    ----------------------------     ----------------------------         PCT < 0.25 ng/mL                PCT < 0.10 ng/mL          Strongly encourage             Strongly discourage   discontinuation of antibiotics    initiation of antibiotics    ----------------------------     -----------------------------       PCT 0.25 - 0.50 ng/mL            PCT 0.10 - 0.25 ng/mL               OR       >80% decrease in PCT            Discourage initiation of                                            antibiotics      Encourage discontinuation           of antibiotics    ----------------------------     -----------------------------         PCT >= 0.50 ng/mL              PCT 0.26 - 0.50 ng/mL               AND        <80% decrease in PCT             Encourage initiation of                                             antibiotics       Encourage continuation           of antibiotics    ----------------------------      -----------------------------        PCT >= 0.50 ng/mL                  PCT > 0.50 ng/mL  AND         increase in PCT                  Strongly encourage                                      initiation of antibiotics    Strongly encourage escalation           of antibiotics                                     -----------------------------                                           PCT <= 0.25 ng/mL                                                 OR                                        > 80% decrease in PCT                                      Discontinue / Do not initiate                                             antibiotics  Performed at [facility], [address] [place], [place] [address]   Lactic acid, plasma     Status: Abnormal   Collection Time: 10/24/21  6:19 PM  Result Value Ref Range   Lactic Acid, Venous 3.0 (HH) 0.5 - 1.9 mmol/L    Comment: CRITICAL RESULT CALLED TO, READ BACK BY AND VERIFIED WITH B.BENTON,RN  10/24/2021 [email or web address].J Performed at [facility], [address], [place], [place] [address]   Culture, blood (Routine x 2)     Status: None (Preliminary result)   Collection Time: 10/24/21  6:19 PM   Specimen: BLOOD  Result Value Ref Range   Specimen Description BLOOD SITE NOT SPECIFIED    Special Requests      BOTTLES DRAWN AEROBIC AND ANAEROBIC Blood Culture adequate volume   Culture      NO GROWTH < 24 HOURS Performed at [facility], [address], [place], [place] [address]    Report Status PENDING   POC occult blood, ED     Status: Abnormal   Collection Time: 10/24/21  6:25 PM  Result Value Ref Range   Fecal Occult Bld POSITIVE (A) NEGATIVE  SARS Coronavirus 2 by RT PCR (hospital order, performed in [facility] hospital lab) *cepheid single result test* Anterior Nasal Swab     Status: None   Collection Time: 10/24/21  8:21 PM   Specimen: Anterior Nasal Swab  Result  Value Ref Range   SARS Coronavirus 2 by RT  PCR NEGATIVE NEGATIVE    Comment: (NOTE) SARS-CoV-2 target nucleic acids are NOT DETECTED.  The SARS-CoV-2 RNA is generally detectable in upper and lower respiratory specimens during the acute phase of infection. The lowest concentration of SARS-CoV-2 viral copies this assay can detect is 250 copies / mL. A negative result does not preclude SARS-CoV-2 infection and should not be used as the sole basis for treatment or other patient management decisions.  A negative result may occur with improper specimen collection / handling, submission of specimen other than nasopharyngeal swab, presence of viral mutation(s) within the areas targeted by this assay, and inadequate number of viral copies (<250 copies / mL). A negative result must be combined with clinical observations, patient history, and epidemiological information.  Fact Sheet for Patients:   RoadLapTop.co.za  Fact Sheet for Healthcare Providers: http://kim-miller.com/  This test is not yet approved or  cleared by the [place] FDA and has been authorized for detection and/or diagnosis of SARS-CoV-2 by FDA under an Emergency Use Authorization (EUA).  This EUA will remain in effect (meaning this test can be used) for the duration of the COVID-19 declaration under Section 564(b)(1) of the Act, 21 U.S.C. section 360bbb-3(b)(1), unless the authorization is terminated or revoked sooner.  Performed at [facility], [address] [place], [place] [address]   Lactic acid, plasma     Status: None   Collection Time: 10/24/21  8:58 PM  Result Value Ref Range   Lactic Acid, Venous 1.4 0.5 - 1.9 mmol/L    Comment: Performed at [facility], [address], [place], [place] [address]  C-reactive protein     Status: Abnormal   Collection Time: 10/24/21  8:58 PM  Result Value Ref Range   CRP 20.0 (H) <1.0 mg/dL    Comment: Performed at [facility], [address]  [place], [place] [address]  Urinalysis, Routine w reflex microscopic     Status: Abnormal   Collection Time: 10/25/21  3:20 AM  Result Value Ref Range   Color, Urine YELLOW YELLOW   APPearance CLEAR CLEAR   Specific Gravity, Urine 1.036 (H) 1.005 - 1.030   pH 5.0 5.0 - 8.0   Glucose, UA NEGATIVE NEGATIVE mg/dL   Hgb urine dipstick NEGATIVE NEGATIVE   Bilirubin Urine NEGATIVE NEGATIVE   Ketones, ur 5 (A) NEGATIVE mg/dL   Protein, ur NEGATIVE NEGATIVE mg/dL   Nitrite NEGATIVE NEGATIVE   Leukocytes,Ua NEGATIVE NEGATIVE    Comment: Performed at [facility], [address] [place], [place] [address]  HIV Antibody (routine testing w rflx)     Status: None   Collection Time: 10/25/21  3:42 AM  Result Value Ref Range   HIV Screen 4th Generation wRfx Non Reactive Non Reactive    Comment: Performed at [facility], [address], [place], [place] [address]  Comprehensive metabolic panel     Status: Abnormal   Collection Time: 10/25/21  3:42 AM  Result Value Ref Range   Sodium 135 135 - 145 mmol/L   Potassium 3.5 3.5 - 5.1 mmol/L   Chloride 104 98 - 111 mmol/L   CO2 24 22 - 32 mmol/L   Glucose, Bld 125 (H) 70 - 99 mg/dL    Comment: Glucose reference range applies only to samples taken after fasting for at least 8 hours.   BUN 6 6 - 20 mg/dL  Creatinine, Ser 0.77 0.61 - 1.24 mg/dL   Calcium 8.3 (L) 8.9 - 10.3 mg/dL   Total Protein 6.2 (L) 6.5 - 8.1 g/dL   Albumin 2.0 (L) 3.5 - 5.0 g/dL   AST 9 (L) 15 - 41 U/L   ALT 9 0 - 44 U/L   Alkaline Phosphatase 53 38 - 126 U/L   Total Bilirubin 0.9 0.3 - 1.2 mg/dL   GFR, Estimated >68 >12 mL/min    Comment: (NOTE) Calculated using the CKD-EPI Creatinine Equation (2021)    Anion gap 7 5 - 15    Comment: Performed at [facility], [address] [place], [place] [address]  Magnesium     Status: None   Collection Time: 10/25/21  3:42 AM  Result Value Ref Range   Magnesium 1.7 1.7 - 2.4 mg/dL    Comment: Performed at [facility], [address], [place], [place] [address]  CBC     Status: Abnormal   Collection Time: 10/25/21  3:42 AM  Result Value Ref Range   WBC 7.9 4.0 - 10.5 K/uL   RBC 4.00 (L) 4.22 - 5.81 MIL/uL   Hemoglobin 9.5 (L) 13.0 - 17.0 g/dL   HCT 44.9 (L) 67.5 - 91.6 %   MCV 74.3 (L) 80.0 - 100.0 fL   MCH 23.8 (L) 26.0 - 34.0 pg   MCHC 32.0 30.0 - 36.0 g/dL   RDW 38.4 66.5 - 99.3 %   Platelets 420 (H) 150 - 400 K/uL   nRBC 0.0 0.0 - 0.2 %    Comment: Performed at [facility], [address] [place], [place] [address]  C Difficile Quick Screen w PCR reflex     Status: None   Collection Time: 10/25/21  7:42 AM   Specimen: STOOL  Result Value Ref Range   C Diff antigen NEGATIVE NEGATIVE   C Diff toxin NEGATIVE NEGATIVE   C Diff interpretation No C. difficile detected.     Comment: Performed at [facility], [address] [place], [place] [address]  Gastrointestinal Panel by PCR , Stool     Status: None   Collection Time: 10/25/21  7:43 AM   Specimen: STOOL  Result Value Ref Range   Campylobacter species NOT DETECTED NOT DETECTED   Plesimonas shigelloides NOT DETECTED NOT DETECTED   Salmonella species NOT DETECTED NOT DETECTED   Yersinia enterocolitica NOT DETECTED NOT DETECTED   Vibrio species NOT DETECTED NOT DETECTED   Vibrio cholerae NOT DETECTED NOT DETECTED   Enteroaggregative E coli (EAEC) NOT DETECTED NOT DETECTED   Enteropathogenic E coli (EPEC) NOT DETECTED NOT DETECTED   Enterotoxigenic E coli (ETEC) NOT DETECTED NOT DETECTED   Shiga like toxin producing E coli (STEC) NOT DETECTED NOT DETECTED   Shigella/Enteroinvasive E coli (EIEC) NOT DETECTED NOT DETECTED   Cryptosporidium NOT DETECTED NOT DETECTED   Cyclospora cayetanensis NOT DETECTED NOT DETECTED   Entamoeba histolytica NOT DETECTED NOT DETECTED   Giardia lamblia NOT DETECTED NOT DETECTED   Adenovirus F40/41 NOT DETECTED NOT DETECTED   Astrovirus NOT DETECTED NOT DETECTED   Norovirus GI/GII NOT  DETECTED NOT DETECTED   Rotavirus A NOT DETECTED NOT DETECTED   Sapovirus (I, II, IV, and V) NOT DETECTED NOT DETECTED    Comment: Performed at [facility], [address] [place], [place] [address]  CBC     Status: Abnormal   Collection Time: 10/25/21 10:26 AM  Result Value Ref Range  WBC 6.9 4.0 - 10.5 K/uL   RBC 3.85 (L) 4.22 - 5.81 MIL/uL   Hemoglobin 9.2 (L) 13.0 - 17.0 g/dL   HCT 95.6 (L) 21.3 - 08.6 %   MCV 73.8 (L) 80.0 - 100.0 fL   MCH 23.9 (L) 26.0 - 34.0 pg   MCHC 32.4 30.0 - 36.0 g/dL   RDW 57.8 46.9 - 62.9 %   Platelets 401 (H) 150 - 400 K/uL   nRBC 0.0 0.0 - 0.2 %    Comment: Performed at [facility], [address] [place], [place] [address]  Glucose, capillary     Status: Abnormal   Collection Time: 10/25/21 11:30 PM  Result Value Ref Range   Glucose-Capillary 104 (H) 70 - 99 mg/dL    Comment: Glucose reference range applies only to samples taken after fasting for at least 8 hours.   Comment 1 Notify RN    Comment 2 Document in Chart   Glucose, capillary     Status: Abnormal   Collection Time: 10/26/21  4:25 AM  Result Value Ref Range   Glucose-Capillary 112 (H) 70 - 99 mg/dL    Comment: Glucose reference range applies only to samples taken after fasting for at least 8 hours.  Glucose, capillary     Status: Abnormal   Collection Time: 10/26/21  7:42 AM  Result Value Ref Range   Glucose-Capillary 114 (H) 70 - 99 mg/dL    Comment: Glucose reference range applies only to samples taken after fasting for at least 8 hours.   CT ABDOMEN PELVIS W CONTRAST  Result Date: 10/25/2021 CLINICAL DATA:  Abdominal pain. EXAM: CT ABDOMEN AND PELVIS WITH CONTRAST TECHNIQUE: Multidetector CT imaging of the abdomen and pelvis was performed using the standard protocol following bolus administration of intravenous contrast. RADIATION DOSE REDUCTION: This exam was performed according to the departmental dose-optimization program which includes automated exposure  control, adjustment of the mA and/or kV according to patient size and/or use of iterative reconstruction technique. CONTRAST:  OMNIPAQUE IOHEXOL 300 MG/ML  SOLN COMPARISON:  CT abdomen pelvis dated 10/07/2021. FINDINGS: Lower chest: The visualized lung bases are clear. No intra-abdominal free air or free fluid. Hepatobiliary: No focal liver abnormality is seen. No gallstones, gallbladder wall thickening, or biliary dilatation. Pancreas: Unremarkable. No pancreatic ductal dilatation or surrounding inflammatory changes. Spleen: Normal in size without focal abnormality. Adrenals/Urinary Tract: Adrenal glands are unremarkable. Kidneys are normal, without renal calculi, focal lesion, or hydronephrosis. Bladder is unremarkable. Stomach/Bowel: There is inflammatory changes and thickening of the descending colon consistent with colitis. Clinical correlation is recommended. There is no bowel obstruction. The appendix is normal. Vascular/Lymphatic: The abdominal aorta and IVC are unremarkable. No portal venous gas. There is no adenopathy. Reproductive: The prostate and seminal vesicles are grossly unremarkable. No pelvic mass. Other: None Musculoskeletal: Bilateral L5 pars defects. No listhesis. No acute osseous pathology. IMPRESSION: Colitis of the descending colon. No bowel obstruction. Normal appendix. Electronically Signed   By: Elgie Collard M.D.   On: 10/25/2021 00:51   DG Chest Port 1 View  Result Date: 10/24/2021 CLINICAL DATA:  Weakness EXAM: PORTABLE CHEST 1 VIEW COMPARISON:  09/14/2021 FINDINGS: Heart and mediastinal contours are within normal limits. No focal opacities or effusions. No acute bony abnormality. IMPRESSION: No active disease. Electronically Signed   By: Charlett Nose M.D.   On: 10/24/2021 19:49      Assessment/Plan Perirectal abscess - not commented on CT 7/14 but able to see on review of  images - on exam pt has purulent drainage but unable to assess if persistent cellulitis or how  adequately this is draining with patient's behavioral limitations - GI planning EGD/colonoscopy at some point due to concern for GI bleeding - discussed with patient's father that this would be a good time to examine and then complete bedside I&D if needed. He is agreeable to this plan.  - in the meantime recommend continued abx and sitz baths to encourage drainage and try to keep clean after BMs  FEN: NPO, LR @ 125cc/h VTE: SCDs ID: [email or web address]  Autism GI bleeding - per GI   I reviewed Consultant GI notes, hospitalist notes, last 24 h vitals and pain scores, last 48 h intake and output, last 24 h labs and trends, and last 24 h imaging results.    Juliet Rude, [facility] 10/26/2021, 8:05 AM Please see Amion for pager number during day hours 7:00am-4:30pm

## 2021-10-26 NOTE — Progress Notes (Addendum)
PROGRESS NOTE    Donald Gallagher  WUJ:[id number] DOB: Mar 29, 1981 DOA: 10/24/2021 PCP: Junie Spencer, FNP    Brief Narrative:  41 year old male with past medical history of autism presenting with bright red blood per rectum. Of note, patient is minimally verbal due to autism and is only able to give yes/no answers.  Majority the history has been obtained from discussions with the emergency department staff, review of notes and discussions with family. Patient has been experiencing an approximate 31-month history of cough and difficulty swallowing.  Symptoms have been evaluated multiple times with various providers including outpatient evaluation by his primary care provider.  Multiple work-ups have ensued including 2 negative strep tests and a course of Augmentin for suspected pharyngitis. Most recently, patient presented to [facility] emergency department 6/27 due to an episode of an episode of possible hematemesis, persisting cough/throat pain and decreasing ability to tolerate oral intake.  During that evaluation, CT imaging of the abdomen and pelvis was performed which was unremarkable.  CBC was noted to be 12.5 and the patient was therefore discharged home on a daily PPI with instructions to follow-up as an outpatient with ENT and GI. Since that visit, patient has followed up as an outpatient with GI in [place] and plan was for patient to to undergo an EGD next week.  Patient has continued to exhibit extremely poor oral intake since. on 7/14, patient's mother reports  2 bloody bowel movements comprised of dark red blood with clots.  Mother also reports the patient's been complaining of intermittent abdominal pain although his ability to communicate is limited.  Mother denies any alcohol use NSAID use or ingestion of foreign objects.  Upon further questioning mother also attests to a several week history of frequent loose stools.  Patient continued to exhibit extremely poor oral intake over the past  several weeks with an approximate 35 pound weight loss.    Assessment and Plan: Acute upper GI bleed/lower GI bleed Question of possible hematemesis several weeks ago and now patient is presenting with 2 bowel movements comprised of dark red blood with clots Presence of several week history of odynophasia and poor oral intake suggest that this is likely upper GI in origin  Patient is a limited ability to provide a history due to being minimally verbal due to autism Initiating intravenous PPI every 12 hours Dr. Elnoria Howard: EGD/colonoscopy in AM? Serial CBCs We will transfuse if hemoglobin drops less than 7  Sepsis due to colitis/perirectal abscess CT imaging of the chest abdomen and pelvis: Colitis of the descending colon Continue cipro/flagyl only for now C diff and GI pathogen panel negative Abscess appears to be draining but exam difficult-- GS consult  Lactic acidosis Lactic acidosis likely secondary to degree of volume depletion in the setting of poor oral intake  Resolved with IVF  Odynophagia Best way to approach evaluating this ongoing problem is to proceed with a EGD  GI consult- Elnoria Howard  Autistic disorder Minimally verbal Minimizing uncomfortable stimuli Encouraging family to remain at bedside is much as possible   Acute urinary retention Foley placed Voiding trial prior to d/c       DVT prophylaxis: SCDs Start: 10/24/21 2235    Code Status: Full Code Family Communication:   Disposition Plan:  Level of care: Progressive Status is: inpt    Consultants:  GI   Subjective: Says he slept well last night  Objective: Vitals:   10/26/21 0000 10/26/21 0424 10/26/21 0737 10/26/21 0739  BP:  134/83  137/90  Pulse: (!) 115 (!) 109  (!) 105  Resp:    20  Temp: 99.2 F (37.3 C) 98.7 F (37.1 C) 98.6 F (37 C)   TempSrc: Oral Oral Oral   SpO2:  96%  96%  Weight:  77.6 kg    Height:        Intake/Output Summary (Last 24 hours) at 10/26/2021 7672 Last data  filed at 10/26/2021 0739 Gross per 24 hour  Intake 880 ml  Output 3758 ml  Net -2878 ml   Filed Weights   10/24/21 1642 10/25/21 0507 10/26/21 0424  Weight: 72.6 kg 75.3 kg 77.6 kg    Examination:   General: Appearance:     Overweight male in no acute distress     Lungs:      respirations unlabored  Heart:    Tachycardic.   MS:   All extremities are intact.   Neurologic:   Awake, alert       Data Reviewed: I have personally reviewed following labs and imaging studies  CBC: Recent Labs  Lab 10/24/21 1743 10/25/21 0342 10/25/21 1026 10/26/21 0800  WBC 8.2 7.9 6.9 6.1  NEUTROABS 5.0  --   --   --   HGB 11.6* 9.5* 9.2* 9.7*  HCT 37.4* 29.7* 28.4* 29.9*  MCV 77.3* 74.3* 73.8* 72.9*  PLT 538* 420* 401* 377   Basic Metabolic Panel: Recent Labs  Lab 10/24/21 1743 10/25/21 0342 10/26/21 0800  NA 136 135 138  K 3.7 3.5 3.7  CL 97* 104 99  CO2 23 24 25   GLUCOSE 105* 125* 114*  BUN 5* 6 <5*  CREATININE 0.79 0.77 0.69  CALCIUM 9.0 8.3* 8.4*  MG  --  1.7  --    GFR: Estimated Creatinine Clearance: 119.1 mL/min (by C-G formula based on SCr of 0.69 mg/dL). Liver Function Tests: Recent Labs  Lab 10/24/21 1743 10/25/21 0342  AST 21 9*  ALT 12 9  ALKPHOS 66 53  BILITOT 0.6 0.9  PROT 7.7 6.2*  ALBUMIN 2.5* 2.0*   No results for input(s): "LIPASE", "AMYLASE" in the last 168 hours. No results for input(s): "AMMONIA" in the last 168 hours. Coagulation Profile: Recent Labs  Lab 10/24/21 1743  INR 1.4*   Cardiac Enzymes: No results for input(s): "CKTOTAL", "CKMB", "CKMBINDEX", "TROPONINI" in the last 168 hours. BNP (last 3 results) No results for input(s): "PROBNP" in the last 8760 hours. HbA1C: No results for input(s): "HGBA1C" in the last 72 hours. CBG: Recent Labs  Lab 10/25/21 2330 10/26/21 0425 10/26/21 0742  GLUCAP 104* 112* 114*   Lipid Profile: No results for input(s): "CHOL", "HDL", "LDLCALC", "TRIG", "CHOLHDL", "LDLDIRECT" in the last 72  hours. Thyroid Function Tests: No results for input(s): "TSH", "T4TOTAL", "FREET4", "T3FREE", "THYROIDAB" in the last 72 hours. Anemia Panel: No results for input(s): "VITAMINB12", "FOLATE", "FERRITIN", "TIBC", "IRON", "RETICCTPCT" in the last 72 hours. Sepsis Labs: Recent Labs  Lab 10/24/21 1743 10/24/21 1819 10/24/21 2058  PROCALCITON <0.10  --   --   LATICACIDVEN  --  3.0* 1.4    Recent Results (from the past 240 hour(s))  Culture, blood (Routine x 2)     Status: None (Preliminary result)   Collection Time: 10/24/21  5:23 PM   Specimen: BLOOD  Result Value Ref Range Status   Specimen Description BLOOD BLOOD LEFT HAND  Final   Special Requests   Final    BOTTLES DRAWN AEROBIC AND ANAEROBIC Blood Culture results may not be optimal due  to an inadequate volume of blood received in culture bottles   Culture   Final    NO GROWTH 2 DAYS Performed at [facility] Lab, [address] [place], [place] [address]    Report Status PENDING  Incomplete  Culture, blood (Routine x 2)     Status: None (Preliminary result)   Collection Time: 10/24/21  6:19 PM   Specimen: BLOOD  Result Value Ref Range Status   Specimen Description BLOOD SITE NOT SPECIFIED  Final   Special Requests   Final    BOTTLES DRAWN AEROBIC AND ANAEROBIC Blood Culture adequate volume   Culture   Final    NO GROWTH 2 DAYS Performed at [facility], [address] [place], [place] [address]    Report Status PENDING  Incomplete  SARS Coronavirus 2 by RT PCR (hospital order, performed in [facility] hospital lab) *cepheid single result test* Anterior Nasal Swab     Status: None   Collection Time: 10/24/21  8:21 PM   Specimen: Anterior Nasal Swab  Result Value Ref Range Status   SARS Coronavirus 2 by RT PCR NEGATIVE NEGATIVE Final    Comment: (NOTE) SARS-CoV-2 target nucleic acids are NOT DETECTED.  The SARS-CoV-2 RNA is generally detectable in upper and lower respiratory specimens during the acute phase of  infection. The lowest concentration of SARS-CoV-2 viral copies this assay can detect is 250 copies / mL. A negative result does not preclude SARS-CoV-2 infection and should not be used as the sole basis for treatment or other patient management decisions.  A negative result may occur with improper specimen collection / handling, submission of specimen other than nasopharyngeal swab, presence of viral mutation(s) within the areas targeted by this assay, and inadequate number of viral copies (<250 copies / mL). A negative result must be combined with clinical observations, patient history, and epidemiological information.  Fact Sheet for Patients:   RoadLapTop.co.za  Fact Sheet for Healthcare Providers: http://kim-miller.com/  This test is not yet approved or  cleared by the [place] FDA and has been authorized for detection and/or diagnosis of SARS-CoV-2 by FDA under an Emergency Use Authorization (EUA).  This EUA will remain in effect (meaning this test can be used) for the duration of the COVID-19 declaration under Section 564(b)(1) of the Act, 21 U.S.C. section 360bbb-3(b)(1), unless the authorization is terminated or revoked sooner.  Performed at [facility] Lab, [address] [place], [place] [address]   C Difficile Quick Screen w PCR reflex     Status: None   Collection Time: 10/25/21  7:42 AM   Specimen: STOOL  Result Value Ref Range Status   C Diff antigen NEGATIVE NEGATIVE Final   C Diff toxin NEGATIVE NEGATIVE Final   C Diff interpretation No C. difficile detected.  Final    Comment: Performed at [facility], [address], [place], [place] [address]  Gastrointestinal Panel by PCR , Stool     Status: None   Collection Time: 10/25/21  7:43 AM   Specimen: STOOL  Result Value Ref Range Status   Campylobacter species NOT DETECTED NOT DETECTED Final   Plesimonas shigelloides NOT DETECTED NOT DETECTED Final    Salmonella species NOT DETECTED NOT DETECTED Final   Yersinia enterocolitica NOT DETECTED NOT DETECTED Final   Vibrio species NOT DETECTED NOT DETECTED Final   Vibrio cholerae NOT DETECTED NOT DETECTED Final   Enteroaggregative E coli (EAEC) NOT DETECTED NOT DETECTED Final  Enteropathogenic E coli (EPEC) NOT DETECTED NOT DETECTED Final   Enterotoxigenic E coli (ETEC) NOT DETECTED NOT DETECTED Final   Shiga like toxin producing E coli (STEC) NOT DETECTED NOT DETECTED Final   Shigella/Enteroinvasive E coli (EIEC) NOT DETECTED NOT DETECTED Final   Cryptosporidium NOT DETECTED NOT DETECTED Final   Cyclospora cayetanensis NOT DETECTED NOT DETECTED Final   Entamoeba histolytica NOT DETECTED NOT DETECTED Final   Giardia lamblia NOT DETECTED NOT DETECTED Final   Adenovirus F40/41 NOT DETECTED NOT DETECTED Final   Astrovirus NOT DETECTED NOT DETECTED Final   Norovirus GI/GII NOT DETECTED NOT DETECTED Final   Rotavirus A NOT DETECTED NOT DETECTED Final   Sapovirus (I, II, IV, and V) NOT DETECTED NOT DETECTED Final    Comment: Performed at [facility], [address], [place], [place] [address]         Radiology Studies: CT ABDOMEN PELVIS W CONTRAST  Result Date: 10/25/2021 CLINICAL DATA:  Abdominal pain. EXAM: CT ABDOMEN AND PELVIS WITH CONTRAST TECHNIQUE: Multidetector CT imaging of the abdomen and pelvis was performed using the standard protocol following bolus administration of intravenous contrast. RADIATION DOSE REDUCTION: This exam was performed according to the departmental dose-optimization program which includes automated exposure control, adjustment of the mA and/or kV according to patient size and/or use of iterative reconstruction technique. CONTRAST:  OMNIPAQUE IOHEXOL 300 MG/ML  SOLN COMPARISON:  CT abdomen pelvis dated 10/07/2021. FINDINGS: Lower chest: The visualized lung bases are clear. No intra-abdominal free air or free fluid. Hepatobiliary: No focal liver  abnormality is seen. No gallstones, gallbladder wall thickening, or biliary dilatation. Pancreas: Unremarkable. No pancreatic ductal dilatation or surrounding inflammatory changes. Spleen: Normal in size without focal abnormality. Adrenals/Urinary Tract: Adrenal glands are unremarkable. Kidneys are normal, without renal calculi, focal lesion, or hydronephrosis. Bladder is unremarkable. Stomach/Bowel: There is inflammatory changes and thickening of the descending colon consistent with colitis. Clinical correlation is recommended. There is no bowel obstruction. The appendix is normal. Vascular/Lymphatic: The abdominal aorta and IVC are unremarkable. No portal venous gas. There is no adenopathy. Reproductive: The prostate and seminal vesicles are grossly unremarkable. No pelvic mass. Other: None Musculoskeletal: Bilateral L5 pars defects. No listhesis. No acute osseous pathology. IMPRESSION: Colitis of the descending colon. No bowel obstruction. Normal appendix. Electronically Signed   By: Elgie Collard M.D.   On: 10/25/2021 00:51   DG Chest Port 1 View  Result Date: 10/24/2021 CLINICAL DATA:  Weakness EXAM: PORTABLE CHEST 1 VIEW COMPARISON:  09/14/2021 FINDINGS: Heart and mediastinal contours are within normal limits. No focal opacities or effusions. No acute bony abnormality. IMPRESSION: No active disease. Electronically Signed   By: Charlett Nose M.D.   On: 10/24/2021 19:49        Scheduled Meds:  Chlorhexidine Gluconate Cloth  6 each Topical Daily   pantoprazole (PROTONIX) IV  40 mg Intravenous Q12H   sodium chloride flush  3 mL Intravenous Q12H   Continuous Infusions:  ciprofloxacin Stopped (10/26/21 0733)   metronidazole Stopped (10/26/21 0733)     LOS: 1 day    Time spent: 45 minutes spent on chart review, discussion with nursing staff, consultants, updating family and interview/physical exam; more than 50% of that time was spent in counseling and/or coordination of care.    Joseph Art, DO [facility] Available via Epic secure chat 7am-7pm After these hours, please refer to coverage provider listed on [email or web address] 10/26/2021, 9:23 AM

## 2021-10-27 ENCOUNTER — Encounter (HOSPITAL_COMMUNITY)
Admission: EM | Disposition: A | Discharge status: Home / Self Care | Payer: Self-pay | Source: Home / Self Care | Attending: Internal Medicine

## 2021-10-27 ENCOUNTER — Encounter (HOSPITAL_COMMUNITY): Payer: Self-pay | Admitting: Internal Medicine

## 2021-10-27 ENCOUNTER — Inpatient Hospital Stay (HOSPITAL_COMMUNITY): Payer: Medicare Other | Admitting: Anesthesiology

## 2021-10-27 DIAGNOSIS — K603 Anal fistula: Secondary | ICD-10-CM

## 2021-10-27 DIAGNOSIS — R338 Other retention of urine: Secondary | ICD-10-CM | POA: Diagnosis not present

## 2021-10-27 DIAGNOSIS — K626 Ulcer of anus and rectum: Secondary | ICD-10-CM

## 2021-10-27 DIAGNOSIS — K529 Noninfective gastroenteritis and colitis, unspecified: Secondary | ICD-10-CM | POA: Diagnosis not present

## 2021-10-27 DIAGNOSIS — K259 Gastric ulcer, unspecified as acute or chronic, without hemorrhage or perforation: Secondary | ICD-10-CM | POA: Diagnosis not present

## 2021-10-27 DIAGNOSIS — E876 Hypokalemia: Secondary | ICD-10-CM | POA: Diagnosis not present

## 2021-10-27 DIAGNOSIS — F840 Autistic disorder: Secondary | ICD-10-CM | POA: Diagnosis not present

## 2021-10-27 DIAGNOSIS — K922 Gastrointestinal hemorrhage, unspecified: Secondary | ICD-10-CM | POA: Diagnosis not present

## 2021-10-27 DIAGNOSIS — R634 Abnormal weight loss: Secondary | ICD-10-CM

## 2021-10-27 DIAGNOSIS — K633 Ulcer of intestine: Secondary | ICD-10-CM

## 2021-10-27 HISTORY — PX: BIOPSY: SHX5522

## 2021-10-27 HISTORY — PX: COLONOSCOPY WITH PROPOFOL: SHX5780

## 2021-10-27 HISTORY — PX: ESOPHAGOGASTRODUODENOSCOPY (EGD) WITH PROPOFOL: SHX5813

## 2021-10-27 LAB — BASIC METABOLIC PANEL
Anion gap: 9 (ref 5–15)
BUN: <5 mg/dL — ABNORMAL LOW (ref 6–20)
CO2: 22 mmol/L (ref 22–32)
Calcium: 8 mg/dL — ABNORMAL LOW (ref 8.9–10.3)
Chloride: 101 mmol/L (ref 98–111)
Creatinine, Ser: 0.74 mg/dL (ref 0.61–1.24)
GFR, Estimated: >60 mL/min (ref >60)
Glucose, Bld: 117 mg/dL — ABNORMAL HIGH (ref 70–99)
Potassium: 3.2 mmol/L — ABNORMAL LOW (ref 3.5–5.1)
Sodium: 132 mmol/L — ABNORMAL LOW (ref 135–145)

## 2021-10-27 LAB — CBC
HCT: 30 % — ABNORMAL LOW (ref 39.0–52.0)
Hemoglobin: 9.9 g/dL — ABNORMAL LOW (ref 13.0–17.0)
MCH: 24.4 pg — ABNORMAL LOW (ref 26.0–34.0)
MCHC: 33 g/dL (ref 30.0–36.0)
MCV: 73.9 fL — ABNORMAL LOW (ref 80.0–100.0)
Platelets: 392 10*3/uL (ref 150–400)
RBC: 4.06 MIL/uL — ABNORMAL LOW (ref 4.22–5.81)
RDW: 14.9 % (ref 11.5–15.5)
WBC: 7.2 10*3/uL (ref 4.0–10.5)
nRBC: 0 % (ref 0.0–0.2)

## 2021-10-27 LAB — GLUCOSE, CAPILLARY
Glucose-Capillary: 114 mg/dL — ABNORMAL HIGH (ref 70–99)
Glucose-Capillary: 119 mg/dL — ABNORMAL HIGH (ref 70–99)
Glucose-Capillary: 118 mg/dL — ABNORMAL HIGH (ref 70–99)

## 2021-10-27 LAB — HEPATITIS B CORE ANTIBODY, TOTAL: Hep B Core Total Ab: NONREACTIVE

## 2021-10-27 LAB — HEPATITIS B SURFACE ANTIGEN: Hepatitis B Surface Ag: NONREACTIVE

## 2021-10-27 LAB — HEPATITIS B SURFACE ANTIBODY,QUALITATIVE: Hep B S Ab: REACTIVE — AB

## 2021-10-27 SURGERY — ESOPHAGOGASTRODUODENOSCOPY (EGD) WITH PROPOFOL
Anesthesia: Monitor Anesthesia Care

## 2021-10-27 MED ORDER — LIDOCAINE HCL (PF) 1 % IJ SOLN
INTRAMUSCULAR | Status: AC
  Filled 2021-10-27: qty 30

## 2021-10-27 MED ORDER — LACTATED RINGERS IV SOLN: INTRAVENOUS | Status: DC | PRN

## 2021-10-27 MED ORDER — POTASSIUM CHLORIDE CRYS ER 20 MEQ PO TBCR
40.0000 meq | EXTENDED_RELEASE_TABLET | ORAL | Status: AC
  Administered 2021-10-27 (×2): 40 meq via ORAL
  Filled 2021-10-27 (×2): qty 2

## 2021-10-27 MED ORDER — PROPOFOL 10 MG/ML IV BOLUS
INTRAVENOUS | Status: DC | PRN
  Administered 2021-10-27: 30 mg via INTRAVENOUS
  Administered 2021-10-27: 20 mg via INTRAVENOUS

## 2021-10-27 MED ORDER — PROPOFOL 500 MG/50ML IV EMUL
INTRAVENOUS | Status: DC | PRN
  Administered 2021-10-27: 150 ug/kg/min via INTRAVENOUS

## 2021-10-27 MED ORDER — ALBUMIN HUMAN 5 % IV SOLN: INTRAVENOUS | Status: DC | PRN

## 2021-10-27 MED ORDER — LIDOCAINE 2% (20 MG/ML) 5 ML SYRINGE
INTRAMUSCULAR | Status: DC | PRN
  Administered 2021-10-27: 100 mg via INTRAVENOUS

## 2021-10-27 MED ORDER — SODIUM CHLORIDE 0.9 % IV SOLN: INTRAVENOUS | Status: DC

## 2021-10-27 MED ORDER — CIPROFLOXACIN IN D5W 400 MG/200ML IV SOLN
INTRAVENOUS | Status: AC
  Filled 2021-10-27: qty 200

## 2021-10-27 SURGICAL SUPPLY — 25 items

## 2021-10-27 NOTE — Transfer of Care (Signed)
Immediate Anesthesia Transfer of Care Note  Patient: Donald Gallagher  Procedure(s) Performed: ESOPHAGOGASTRODUODENOSCOPY (EGD) WITH PROPOFOL COLONOSCOPY WITH PROPOFOL BIOPSY  Patient Location: PACU  Anesthesia Type:MAC  Level of Consciousness: awake and alert   Airway & Oxygen Therapy: Patient Spontanous Breathing and Patient connected to nasal cannula oxygen  Post-op Assessment: Report given to RN, Post -op Vital signs reviewed and stable and Patient moving all extremities  Post vital signs: Reviewed and stable  Last Vitals:  Vitals Value Taken Time  BP 139/96 10/27/21 1019  Temp    Pulse 116 10/27/21 1026  Resp 27 10/27/21 1026  SpO2 96 % 10/27/21 1026  Vitals shown include unvalidated device data.  Last Pain:  Vitals:   10/27/21 0800  TempSrc: Temporal  PainSc: 0-No pain      Patients Stated Pain Goal: 0 (09/81/19 1478)  Complications: No notable events documented.

## 2021-10-27 NOTE — Progress Notes (Signed)
Day of Surgery  Subjective: Patient seen in pre-op in endo but asleep.  Spoke to mom.  Next time I saw him was while he was under anesthesia for his cscope.  Objective: Vital signs in last 24 hours: Temp:  [97.3 F (36.3 C)-99.6 F (37.6 C)] 97.3 F (36.3 C) (07/17 1020) Pulse Rate:  [99-122] 114 (07/17 1020) Resp:  [16-20] 18 (07/17 1020) BP: (132-151)/(88-99) 139/96 (07/17 1020) SpO2:  [94 %-99 %] 94 % (07/17 1020) Last BM Date : 10/27/21  Intake/Output from previous day: 07/16 0701 - 07/17 0700 In: 2326.7 [P.O.:1060; I.V.:366.7; IV Piggyback:900] Out: 1428 [Urine:1425; Stool:3] Intake/Output this shift: Total I/O In: 950 [I.V.:700; IV Piggyback:250] Out: 650 [Urine:650]  PE: Rectal: fistulous opening around 3 oclock on right side.  Scope with internal opening noted at same location.  No fluctuance, just some induration around this area.  Some excoriation from drainage noted.  Some feculent drainage noted, but no purulent drainage. (See colonoscopy pictures for perirectal imaging)  Lab Results:  Recent Labs    10/26/21 0800 10/27/21 0509  WBC 6.1 7.2  HGB 9.7* 9.9*  HCT 29.9* 30.0*  PLT 377 392   BMET Recent Labs    10/26/21 0800 10/27/21 0509  NA 138 132*  K 3.7 3.2*  CL 99 101  CO2 25 22  GLUCOSE 114* 117*  BUN <5* <5*  CREATININE 0.69 0.74  CALCIUM 8.4* 8.0*   PT/INR Recent Labs    10/24/21 1743  LABPROT 17.1*  INR 1.4*   CMP     Component Value Date/Time   NA 132 (L) 10/27/2021 0509   NA 140 02/08/2018 1534   K 3.2 (L) 10/27/2021 0509   CL 101 10/27/2021 0509   CO2 22 10/27/2021 0509   GLUCOSE 117 (H) 10/27/2021 0509   BUN <5 (L) 10/27/2021 0509   BUN 10 02/08/2018 1534   CREATININE 0.74 10/27/2021 0509   CALCIUM 8.0 (L) 10/27/2021 0509   PROT 6.2 (L) 10/25/2021 0342   PROT 7.2 02/08/2018 1534   ALBUMIN 2.0 (L) 10/25/2021 0342   ALBUMIN 4.4 02/08/2018 1534   AST 9 (L) 10/25/2021 0342   ALT 9 10/25/2021 0342   ALKPHOS 53  10/25/2021 0342   BILITOT 0.9 10/25/2021 0342   BILITOT 0.3 02/08/2018 1534   GFRNONAA >60 10/27/2021 0509   GFRAA 126 02/08/2018 1534   Lipase     Component Value Date/Time   LIPASE 23 10/07/2021 1012       Studies/Results: No results found.  Anti-infectives: Anti-infectives (From admission, onward)    Start     Dose/Rate Route Frequency Ordered Stop   10/25/21 0800  [MAR Hold]  ciprofloxacin (CIPRO) IVPB 400 mg        (MAR Hold since Mon 10/27/2021 at 0802.Hold Reason: Transfer to a Procedural area)   400 mg 200 mL/hr over 60 Minutes Intravenous 2 times daily 10/25/21 0743     10/25/21 0800  [MAR Hold]  metroNIDAZOLE (FLAGYL) IVPB 500 mg        (MAR Hold since Mon 10/27/2021 at 0802.Hold Reason: Transfer to a Procedural area)   500 mg 100 mL/hr over 60 Minutes Intravenous 2 times daily 10/25/21 0744     10/24/21 2100  vancomycin (VANCOCIN) IVPB 1000 mg/200 mL premix        1,000 mg 200 mL/hr over 60 Minutes Intravenous  Once 10/24/21 2049 10/25/21 0135   10/24/21 2100  piperacillin-tazobactam (ZOSYN) IVPB 3.375 g  3.375 g 100 mL/hr over 30 Minutes Intravenous  Once 10/24/21 2049 10/24/21 2211   10/24/21 2100  metroNIDAZOLE (FLAGYL) IVPB 500 mg        500 mg 100 mL/hr over 60 Minutes Intravenous  Once 10/24/21 2049 10/24/21 2346        Assessment/Plan 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  It will continue to drain secondary to this nature.  This does not need acute debridement or drainage. -we will have him follow up with one of our colorectal surgeons for further management of this area -treatment of suspect crohn's per GI, bxs pending -no further surgical needs.  We will sign off at this time and arrange outpatient follow up.  -findings noted in endo suite on my behalf were discussed with patient's mother and father.  Colonoscopy findings were discussed by Dr. [name].  FEN - diet per  primary/GI VTE - on hold due to bleeding ID - cipro/Flagyl  I reviewed Consultant GI notes, hospitalist notes, last 24 h vitals and pain scores, last 48 h intake and output, last 24 h labs and trends, and last 24 h imaging results.   LOS: 2 days    Letha Cape , [facility] Surgery 10/27/2021, 10:32 AM Please see Amion for pager number during day hours 7:00am-4:30pm or 7:00am -11:30am on weekends

## 2021-10-27 NOTE — Anesthesia Preprocedure Evaluation (Signed)
Anesthesia Evaluation  Patient identified by MRN, date of birth, ID band  Reviewed: Allergy & Precautions, NPO status , Patient's Chart, lab work & pertinent test results  Airway Mallampati: II  TM Distance: >3 FB Neck ROM: Full    Dental no notable dental hx. (+) Dental Advisory Given, Teeth Intact   Pulmonary neg pulmonary ROS,    Pulmonary exam normal breath sounds clear to auscultation       Cardiovascular negative cardio ROS Normal cardiovascular exam Rhythm:Regular Rate:Normal     Neuro/Psych PSYCHIATRIC DISORDERS negative neurological ROS     GI/Hepatic negative GI ROS, Neg liver ROS,   Endo/Other  negative endocrine ROS  Renal/GU negative Renal ROS     Musculoskeletal negative musculoskeletal ROS (+)   Abdominal   Peds  Hematology negative hematology ROS (+)   Anesthesia Other Findings   Reproductive/Obstetrics                             Anesthesia Physical Anesthesia Plan  ASA: 2  Anesthesia Plan: MAC   Post-op Pain Management: Minimal or no pain anticipated   Induction: Intravenous  PONV Risk Score and Plan: 1 and Propofol infusion, TIVA and Treatment may vary due to age or medical condition  Airway Management Planned: Natural Airway  Additional Equipment:   Intra-op Plan:   Post-operative Plan:   Informed Consent: I have reviewed the patients History and Physical, chart, labs and discussed the procedure including the risks, benefits and alternatives for the proposed anesthesia with the patient or authorized representative who has indicated his/her understanding and acceptance.     Dental advisory given  Plan Discussed with: CRNA  Anesthesia Plan Comments:         Anesthesia Quick Evaluation

## 2021-10-27 NOTE — Progress Notes (Signed)
Endoscopy reported they gave patient's cipro dose during case.  Pt. Arrived with an empty cipro IVPB bag.

## 2021-10-27 NOTE — Assessment & Plan Note (Signed)
Hyponatremia.   Renal function today with a serum cr of 0,74, K is 3,2 and serum bicarbonate at 22.  Plan to continue K correction with Kcl 80 meq in 2 divided doses and follow up renal function in am.

## 2021-10-27 NOTE — Op Note (Signed)
[facility] Patient Name: Donald Gallagher Procedure Date : 10/27/2021 MRN: [id number] Attending MD: Donald Gallagher , MD Date of Birth: 19-Jun-1980 CSN: [id number] Age: 41 Admit Type: Outpatient Procedure:                Upper GI endoscopy Indications:              Odynophagia, Weight loss Providers:                Donald Cooter L. [name], MD, Donald Gallagher, Donald Gallagher, Technician, Donald Pierini, CRNA Referring MD:             [facility] Medicines:                Monitored Anesthesia Care Complications:            No immediate complications. Estimated Blood Loss:     Estimated blood loss was minimal. Procedure:                Pre-Anesthesia Assessment:                           - Prior to the procedure, a History and Physical                            was performed, and patient medications and                            allergies were reviewed. The patient's tolerance of                            previous anesthesia was also reviewed. The risks                            and benefits of the procedure and the sedation                            options and risks were discussed with the patient.                            All questions were answered, and informed consent                            was obtained. Prior Anticoagulants: The patient has                            taken no previous anticoagulant or antiplatelet                            agents. ASA Grade Assessment: III - A patient with                            severe systemic disease. After reviewing the risks  and benefits, the patient was deemed in                            satisfactory condition to undergo the procedure.                           After obtaining informed consent, the endoscope was                            passed under direct vision. Throughout the                            procedure, the patient's blood pressure, pulse, and                             oxygen saturations were monitored continuously. The                            [id number] Olympus endoscope was introduced                            through the mouth, and advanced to the second part                            of duodenum. The upper GI endoscopy was                            accomplished without difficulty. The patient                            tolerated the procedure well. Scope In: Scope Out: Findings:      The examined esophagus was normal. Several biopsies were obtained in the       lower third of the esophagus with cold forceps for histology. (Jar 3)      Few non-bleeding superficial gastric ulcers with no stigmata of bleeding       were found in the gastric antrum. The largest lesion was 5 mm in largest       dimension. Several biopsies were obtained on the greater curvature of       the gastric body, on the lesser curvature of the gastric body, on the       greater curvature of the gastric antrum and on the lesser curvature of       the gastric antrum with cold forceps for histology.(Jar 2) Biopsies were       taken of the ulcers with a cold forceps for histology. (Jar 1)      The exam of the stomach was otherwise normal.      The cardia and gastric fundus were normal on retroflexion.      The examined duodenum was normal. Impression:               - Normal esophagus.                           - Non-bleeding gastric ulcers with no stigmata of  bleeding. Biopsied.                           - Normal examined duodenum.                           - Several biopsies were obtained in the lower third                            of the esophagus.                           - Several biopsies were obtained on the greater                            curvature of the gastric body, on the lesser                            curvature of the gastric body, on the greater                            curvature of the gastric  antrum and on the lesser                            curvature of the gastric antrum.                           No cause for odynophagia seen. Perceived                            odynophagia (history limted by autism) is most                            likely abdominal pain after eating (see colonoscopy                            report).                           If H pylori negative, these shallow ulcers may be                            Crohn's disease. Recommendation:           - Return patient to hospital ward for ongoing care.                           - Resume regular diet.                           - Continue present medications.                           - Await pathology results.                           - See  the other procedure note for documentation of                            additional recommendations. Procedure Code(s):        --- Professional ---                           [phone], Esophagogastroduodenoscopy, flexible,                            transoral; with biopsy, single or multiple Diagnosis Code(s):        --- Professional ---                           K25.9, Gastric ulcer, unspecified as acute or                            chronic, without hemorrhage or perforation                           R13.10, Dysphagia, unspecified                           R63.4, Abnormal weight loss CPT copyright 2019 American Medical Association. All rights reserved. The codes documented in this report are preliminary and upon coder review may  be revised to meet current compliance requirements. Donald Retzloff [name], MD 10/27/2021 10:06:43 AM This report has been signed electronically. Number of Addenda: 0

## 2021-10-27 NOTE — Hospital Course (Addendum)
Donald Gallagher was admitted to the hospital with the working diagnosis of upper GI bleed.   41 yo male with the past medical history of Autism, who presented with hematochezia, dark blood with clots x2, associated with abdominal pain. Very poor oral intake over last several weeks with 35 lbs weigh loss. As outpatient he was seen by GI for a prior episode of hematemesis and had a EGD scheduled. He has been taking proton pump inhibitor as outpatient. On his initial physical examination his temp was 102.2, blood pressure 120/88, HR 130, RR 30 and 02 saturation 99% on room air. Lungs were clear to auscultation, heart with S1 and S2 present and rhythmic, abdomen with diffuse tenderness to palpation, soft with no masses, positive bowel movements, no lower extremity edema.   Na 136, K 3,7 Cl 97, bicarbonate 23, glucose 105 bun 5 cr 0.79 Wbc 8,2 hgb 11.6 plt 538  INR 1.4   EKG 143 bpm, normal axis, prolonneg qtc 549, sinus rhythm, with no significant ST segment or T wave changes.   CT abdomen and pelvis with colitis in the descending colon.  Patient was placed on IV antibiotics and IV pantoprazole and GI was consulted.  Had urinary retention and foley catheter has been placed.   07/17 EGD with non bleeding gastric ulcers Colonoscopy with multiple ulcers, possible Chron's disease.   07/18 antibiotic therapy changed to po and foley catheter has been removed. Pending final recommendations from GI.

## 2021-10-27 NOTE — TOC Progression Note (Signed)
Transition of Care [facility]) - Progression Note    Patient Details  Name: Donald Gallagher MRN: [id number] Date of Birth: [birth year]  [facility]) CM/SW Contact  Leone Haven, RN Phone Number: 10/27/2021, 5:01 PM  Clinical Narrative:    from home with parents for EGD /colonscoopy ,rectal fistula.  TOC following.         Expected Discharge Plan and Services                                                 Social Determinants of Health (SDOH) Interventions    Readmission Risk Interventions     No data to display

## 2021-10-27 NOTE — Anesthesia Procedure Notes (Signed)
Procedure Name: MAC Date/Time: 10/27/2021 9:14 AM  Performed by: Amadeo Garnet, CRNAPre-anesthesia Checklist: Patient identified, Emergency Drugs available, Suction available and Patient being monitored Patient Re-evaluated:Patient Re-evaluated prior to induction Oxygen Delivery Method: Nasal cannula Preoxygenation: Pre-oxygenation with 100% oxygen Induction Type: IV induction Placement Confirmation: positive ETCO2 Dental Injury: Teeth and Oropharynx as per pre-operative assessment

## 2021-10-27 NOTE — Assessment & Plan Note (Signed)
Patient with upper and lower GI bleed.  His hgb is stable with no signs of current bleeding  hgb is 9,9 today,   EGD with non bleeding gastric ulcers, with no stigmata of bleeding. Possible Crohn's disease.  Colonoscopy with multiple ulcers in the distal rectum, in the sigmoid colon and descending colon at the splenic flexure and in the proximal ascending colon and in the cecum.  Possible Crohn's disease.   Plan to continue conservative medical therapy with ciprofloxacin and metronidazole. Pending biopsies.  Patient tolerating po well with no significant abdominal pain. Continue with pantoprazole.

## 2021-10-27 NOTE — Progress Notes (Signed)
Progress Note   Patient: Donald Gallagher YBO:[id number] DOB: April 30, 1980 DOA: 10/24/2021     2 DOS: the patient was seen and examined on 10/27/2021   Brief hospital course: Donald Gallagher was admitted to the hospital with the working diagnosis of upper GI bleed.   41 yo male with the past medical history of Autism, who presented with hematochezia, dark blood with clots x2, associated with abdominal pain. Very poor oral intake over last several weeks with 35 lbs weigh loss. As outpatient he was seen by GI for a prior episode of hematemesis and had a EGD scheduled. He has been taking proton pump inhibitor as outpatient. On his initial physical examination his temp was 102.2, blood pressure 120/88, HR 130, RR 30 and 02 saturation 99% on room air. Lungs were clear to auscultation, heart with S1 and S2 present and rhythmic, abdomen with diffuse tenderness to palpation, soft with no masses, positive bowel movements, no lower extremity edema.   Na 136, K 3,7 Cl 97, bicarbonate 23, glucose 105 bun 5 cr 0.79 Wbc 8,2 hgb 11.6 plt 538  INR 1.4   EKG 143 bpm, normal axis, prolonneg qtc 549, sinus rhythm, with no significant ST segment or T wave changes.   CT abdomen and pelvis with colitis in the descending colon.  Patient was placed on IV antibiotics and IV pantoprazole and GI was consulted.  Had urinary retention and foley catheter has been placed.   07/17 EGD with non bleeding gastric ulcers Colonoscopy with multiple ulcers, possible Chron's disease.    Assessment and Plan: * Gastrointestinal bleed Patient with upper and lower GI bleed.  His hgb is stable with no signs of current bleeding  hgb is 9,9 today,   EGD with non bleeding gastric ulcers, with no stigmata of bleeding. Possible Crohn's disease.  Colonoscopy with multiple ulcers in the distal rectum, in the sigmoid colon and descending colon at the splenic flexure and in the proximal ascending colon and in the cecum.  Possible Crohn's disease.    Plan to continue conservative medical therapy with ciprofloxacin and metronidazole. Pending biopsies.  Patient tolerating po well with no significant abdominal pain. Continue with pantoprazole.   SIRS (systemic inflammatory response syndrome) (HCC) Inflammation due to colitis, sepsis ruled out.   Autistic disorder Minimally verbal Frequent nursing rounding Minimizing uncomfortable stimuli Encouraging family to remain at bedside is much as possible  Acute urinary retention Continue with foley catheter, will attempt voiding trial in the next 24 hrs.  Hypokalemia Hyponatremia.   Renal function today with a serum cr of 0,74, K is 3,2 and serum bicarbonate at 22.  Plan to continue K correction with Kcl 80 meq in 2 divided doses and follow up renal function in am.         Subjective: Patient with no chest pain or dyspnea, no abdominal pain, 3  Physical Exam: Vitals:   10/27/21 1200 10/27/21 1300 10/27/21 1400 10/27/21 1510  BP:    124/85  Pulse: 99 97 (!) 103 (!) 114  Resp:    20  Temp:    100 F (37.8 C)  TempSrc:    Oral  SpO2: 96% 96% 98% 97%  Weight:      Height:       Neurology awake and alert ENT with mild pallor Cardiovascular with S1 and S2 present and rhythmic with no gallops, rubs or murmurs Respiratory with no rales or wheezing Abdomen not distended  No lower extremity edema  Data Reviewed:  Family Communication: I spoke with patient's parents  at the bedside, we talked in detail about patient's condition, plan of care and prognosis and all questions were addressed.   Disposition: Status is: Inpatient Remains inpatient appropriate because: colitis   Planned Discharge Destination: Home    Author: Coralie Keens, MD 10/27/2021 4:02 PM  For on call review [email or web address].

## 2021-10-27 NOTE — Plan of Care (Signed)
  Problem: Pain Managment: Goal: General experience of comfort will improve Outcome: Completed/Met   Problem: Safety: Goal: Ability to remain free from injury will improve Outcome: Completed/Met

## 2021-10-27 NOTE — Progress Notes (Signed)
Initial Nutrition Assessment  DOCUMENTATION CODES:   Severe malnutrition in context of acute illness/injury  INTERVENTION:   Bedtime snack   Double Protein at meals  Encourage PO intake    NUTRITION DIAGNOSIS:   Severe Malnutrition related to acute illness (new crohn's) as evidenced by moderate fat depletion, moderate muscle depletion, percent weight loss.  GOAL:   Patient will meet greater than or equal to 90% of their needs  MONITOR:   PO intake  REASON FOR ASSESSMENT:   Malnutrition Screening Tool    ASSESSMENT:   Pt with PMH of autism who is minimally verbal only able to give yes/no answers admitted with rectal bleeding.  GI following for severe Crohn's colitis, pathology reports pending, per GI likely will need anti-TNF therapy Pt alert and pleasant. Says he is doing better. Spoke with mom who was at bedside. She reports that about 6 weeks ago pt developed a sore throat and stopped eating a lot of foods. He tolerated soups. He previously weighed 205 lb and is currently down to 168 lb which is an 18% weight loss x 6 weeks.   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   Medications reviewed and include: protonix, prednisone  Ferric gluconate   Labs reviewed: K 3.4 CBG's: 114-119    NUTRITION - FOCUSED PHYSICAL EXAM:  Flowsheet Row Most Recent Value  Orbital Region Mild depletion  Upper Arm Region Moderate depletion  Thoracic and Lumbar Region Moderate depletion  Buccal Region Mild depletion  Temple Region Moderate depletion  Clavicle Bone Region Moderate depletion  Clavicle and Acromion Bone Region Moderate depletion  Scapular Bone Region Moderate depletion  Dorsal Hand Moderate depletion  Patellar Region Mild depletion  Anterior Thigh Region Mild depletion  Posterior Calf Region No depletion  Edema (RD  Assessment) None  Hair Reviewed  Eyes Reviewed  Mouth Reviewed  Skin Reviewed  Nails Reviewed       Diet Order:   Diet Order             Diet regular Room service appropriate? Yes; Fluid consistency: Thin  Diet effective now                   EDUCATION NEEDS:   Education needs have been addressed  Skin:  Skin Assessment: Reviewed RN Assessment (perianal fistula)  Last BM:  7/18 type 7  Height:   Ht Readings from Last 1 Encounters:  10/24/21 5\' 6"  (1.676 m)    Weight:   Wt Readings from Last 1 Encounters:  10/28/21 76.8 kg    BMI:  Body mass index is 27.33 kg/m.  Estimated Nutritional Needs:   Kcal:  2000-2200  Protein:  100-115 grams  Fluid:  >2 L/day  10/30/21., RD, LDN, CNSC See AMiON for contact information

## 2021-10-27 NOTE — Op Note (Signed)
[facility] Patient Name: Donald Gallagher Procedure Date : 10/27/2021 MRN: [id number] Attending MD: Starr Lake. Donald Gallagher , MD Date of Birth: February 20, 1981 CSN: [id number] Age: 41 Admit Type: Outpatient Procedure:                Colonoscopy Indications:              Lower abdominal pain, , Chronic diarrhea, Abnormal                            CT of the GI tract, Weight loss                           draining buttock abscess, on Abx, surgical service                            has evaluated Providers:                Sherilyn Cooter L. Donald Neither, MD, Fransisca Connors, Champ Mungo, Technician, Baker Pierini, CRNA Referring MD:             [facility] Medicines:                Monitored Anesthesia Care Complications:            No immediate complications. Estimated Blood Loss:     Estimated blood loss was minimal. Procedure:                Pre-Anesthesia Assessment:                           - Prior to the procedure, a History and Physical                            was performed, and patient medications and                            allergies were reviewed. The patient's tolerance of                            previous anesthesia was also reviewed. The risks                            and benefits of the procedure and the sedation                            options and risks were discussed with the patient.                            All questions were answered, and informed consent                            was obtained. Prior Anticoagulants: The patient has  taken no previous anticoagulant or antiplatelet                            agents. ASA Grade Assessment: III - A patient with                            severe systemic disease. After reviewing the risks                            and benefits, the patient was deemed in                            satisfactory condition to undergo the procedure.                           After  obtaining informed consent, the colonoscope                            was passed under direct vision. Throughout the                            procedure, the patient's blood pressure, pulse, and                            oxygen saturations were monitored continuously. The                            [id number] Olympus coloscope was                            introduced through the anus and advanced to the the                            terminal ileum, with identification of the                            appendiceal orifice and IC valve. The colonoscopy                            was performed without difficulty. The patient                            tolerated the procedure well. The quality of the                            bowel preparation was poor. The terminal ileum,                            ileocecal valve, appendiceal orifice, and rectum                            were photographed. Scope In: 9:39:24 AM Scope Out: 9:54:33 AM Scope Withdrawal Time: 0 hours 12 minutes 49 seconds  Total Procedure Duration: 0 hours 15  minutes 9 seconds  Findings:      The perianal exam findings include a draining perianal fistula over a       3cm area of fluctuance. (right side) DRE revealed irregular tissue at       anal verge right side (about 9 o clock position)      The terminal ileum appeared normal.      Multiple ulcers were found in the distal rectum, in the sigmoid colon,       in the descending colon, at the splenic flexure, in the proximal       ascending colon and in the cecum. No bleeding was present. Biopsies were       taken with a cold forceps for histology (from cecal ulcer).      The distal rectal ulcer extended to the anal verge and was in line with       a small fistulous opening in the anal canal.      The exam was otherwise without abnormality.(given limited visualization       - POOR PREP) Impression:               - Preparation of the colon was poor.                            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                           - The examined portion of the ileum was normal.                           - Multiple ulcers in the distal rectum, in the                            sigmoid colon, in the descending colon, at the                            splenic flexure, in the proximal ascending colon                            and in the cecum. Biopsied.                           - The examination was otherwise normal.                           This appears to be severe Crohn's colitis. Recommendation:           - Return patient to hospital ward for ongoing care.                           - Resume regular diet.                           - Continue present medications.                           - Await pathology results.                           -  Additional labs will be obtained in anticipation                            of likely need for outpatient anti-TNF therapy.                           - Results conveyed to patient's mother.                           Surgical PA was present toward end of exam to see                            findings and examine prei-anal area. Procedure Code(s):        --- Professional ---                           [phone], Colonoscopy, flexible; with biopsy, single                            or multiple Diagnosis Code(s):        --- Professional ---                           K60.3, Anal fistula                           K62.6, Ulcer of anus and rectum                           K63.3, Ulcer of intestine                           R10.30, Lower abdominal pain, unspecified                           K52.9, Noninfective gastroenteritis and colitis,                            unspecified                           R63.4, Abnormal weight loss                           R93.3, Abnormal findings on diagnostic imaging of                            other parts of digestive tract CPT copyright 2019 American Medical  Association. All rights reserved. The codes documented in this report are preliminary and upon coder review may  be revised to meet current compliance requirements. Bergen Melle L. Donald Neither, MD 10/27/2021 10:18:21 AM This report has been signed electronically. Number of Addenda: 0

## 2021-10-27 NOTE — Anesthesia Postprocedure Evaluation (Signed)
Anesthesia Post Note  Patient: Donald Gallagher  Procedure(s) Performed: ESOPHAGOGASTRODUODENOSCOPY (EGD) WITH PROPOFOL COLONOSCOPY WITH PROPOFOL BIOPSY     Patient location during evaluation: PACU Anesthesia Type: MAC Level of consciousness: awake and alert Pain management: pain level controlled Vital Signs Assessment: post-procedure vital signs reviewed and stable Respiratory status: spontaneous breathing Cardiovascular status: stable Anesthetic complications: no   No notable events documented.  Last Vitals:  Vitals:   10/27/21 1107 10/27/21 1124  BP: 133/88   Pulse: (!) 106 99  Resp: 20   Temp: 36.8 C   SpO2: 96% 94%    Last Pain:  Vitals:   10/27/21 1107  TempSrc: Oral  PainSc: 0-No pain                 Nolon Nations

## 2021-10-27 NOTE — Interval H&P Note (Signed)
History and Physical Interval Note:  10/27/2021 9:09 AM  Donald Gallagher  has presented today for surgery, with the diagnosis of Dysphagia and hematochezia.  The various methods of treatment have been discussed with the patient and family. After consideration of risks, benefits and other options for treatment, the patient has consented to  Procedure(s): ESOPHAGOGASTRODUODENOSCOPY (EGD) WITH PROPOFOL (N/A) COLONOSCOPY WITH PROPOFOL (N/A) as a surgical intervention.  The patient's history has been reviewed, patient examined, no change in status, stable for surgery.  I have reviewed the patient's chart and labs.  Questions were answered to the patient's satisfaction.    I saw this patient in the endoscopy preprocedure area and discussed his case and planned procedures with his mother Donald Gallagher.  She was agreeable to the EGD and colonoscopy after discussion of procedure and risks.  The benefits and risks of the planned procedure were described in detail with the patient or (when appropriate) their health care proxy.  Risks were outlined as including, but not limited to, bleeding, infection, perforation, adverse medication reaction leading to cardiac or pulmonary decompensation, pancreatitis (if ERCP).  The limitation of incomplete mucosal visualization was also discussed.  No guarantees or warranties were given.  Dr. Haywood Pao consent note was reviewed its in entirety, data and imaging reports reviewed as well.  Donald Gallagher

## 2021-10-27 NOTE — Assessment & Plan Note (Signed)
Inflammation due to colitis, sepsis ruled out.

## 2021-10-27 NOTE — Assessment & Plan Note (Signed)
Removed foley cathter today for voiding trial. Out of bed to chair tid with meals and ambulate in the hallway.

## 2021-10-28 ENCOUNTER — Encounter (HOSPITAL_COMMUNITY)
Admission: EM | Disposition: A | Discharge status: Home / Self Care | Payer: Self-pay | Source: Home / Self Care | Attending: Internal Medicine

## 2021-10-28 ENCOUNTER — Ambulatory Visit (HOSPITAL_COMMUNITY): Admission: RE | Admit: 2021-10-28 | Payer: Medicare Other | Source: Home / Self Care | Admitting: Gastroenterology

## 2021-10-28 DIAGNOSIS — R338 Other retention of urine: Secondary | ICD-10-CM | POA: Diagnosis not present

## 2021-10-28 DIAGNOSIS — F840 Autistic disorder: Secondary | ICD-10-CM | POA: Diagnosis not present

## 2021-10-28 DIAGNOSIS — K529 Noninfective gastroenteritis and colitis, unspecified: Secondary | ICD-10-CM | POA: Diagnosis not present

## 2021-10-28 DIAGNOSIS — K922 Gastrointestinal hemorrhage, unspecified: Secondary | ICD-10-CM | POA: Diagnosis not present

## 2021-10-28 DIAGNOSIS — D500 Iron deficiency anemia secondary to blood loss (chronic): Secondary | ICD-10-CM

## 2021-10-28 LAB — BASIC METABOLIC PANEL
Anion gap: 7 (ref 5–15)
BUN: <5 mg/dL — ABNORMAL LOW (ref 6–20)
CO2: 25 mmol/L (ref 22–32)
Calcium: 7.8 mg/dL — ABNORMAL LOW (ref 8.9–10.3)
Chloride: 104 mmol/L (ref 98–111)
Creatinine, Ser: 0.76 mg/dL (ref 0.61–1.24)
GFR, Estimated: >60 mL/min (ref >60)
Glucose, Bld: 96 mg/dL (ref 70–99)
Potassium: 3.4 mmol/L — ABNORMAL LOW (ref 3.5–5.1)
Sodium: 136 mmol/L (ref 135–145)

## 2021-10-28 LAB — HEMOGLOBIN AND HEMATOCRIT, BLOOD
HCT: 28.1 % — ABNORMAL LOW (ref 39.0–52.0)
Hemoglobin: 9.1 g/dL — ABNORMAL LOW (ref 13.0–17.0)

## 2021-10-28 SURGERY — ESOPHAGOGASTRODUODENOSCOPY (EGD) WITH PROPOFOL
Anesthesia: Monitor Anesthesia Care

## 2021-10-28 MED ORDER — METRONIDAZOLE 500 MG PO TABS
500.0000 mg | ORAL_TABLET | Freq: Two times a day (BID) | ORAL | Status: DC
  Administered 2021-10-28 – 2021-10-29 (×3): 500 mg via ORAL
  Filled 2021-10-28 (×3): qty 1

## 2021-10-28 MED ORDER — PREDNISONE 20 MG PO TABS
40.0000 mg | ORAL_TABLET | Freq: Every day | ORAL | Status: DC
  Administered 2021-10-28 – 2021-10-29 (×2): 40 mg via ORAL
  Filled 2021-10-28 (×2): qty 2

## 2021-10-28 MED ORDER — MAGNESIUM SULFATE 2 GM/50ML IV SOLN
2.0000 g | Freq: Once | INTRAVENOUS | Status: AC
Start: 2021-10-28 — End: 2021-10-29
  Administered 2021-10-28: 2 g via INTRAVENOUS
  Filled 2021-10-28: qty 50

## 2021-10-28 MED ORDER — PANTOPRAZOLE SODIUM 40 MG PO TBEC
40.0000 mg | DELAYED_RELEASE_TABLET | Freq: Every day | ORAL | Status: DC
  Administered 2021-10-28 – 2021-10-29 (×2): 40 mg via ORAL
  Filled 2021-10-28 (×2): qty 1

## 2021-10-28 MED ORDER — POTASSIUM CHLORIDE CRYS ER 20 MEQ PO TBCR
30.0000 meq | EXTENDED_RELEASE_TABLET | Freq: Four times a day (QID) | ORAL | Status: AC
  Administered 2021-10-28 (×2): 30 meq via ORAL
  Filled 2021-10-28 (×2): qty 1

## 2021-10-28 MED ORDER — CIPROFLOXACIN HCL 250 MG PO TABS
250.0000 mg | ORAL_TABLET | Freq: Two times a day (BID) | ORAL | Status: DC
  Administered 2021-10-28 – 2021-10-29 (×2): 250 mg via ORAL
  Filled 2021-10-28 (×4): qty 1

## 2021-10-28 MED ORDER — SODIUM CHLORIDE 0.9 % IV SOLN
250.0000 mg | Freq: Every day | INTRAVENOUS | Status: AC
  Administered 2021-10-28 – 2021-10-29 (×2): 250 mg via INTRAVENOUS
  Filled 2021-10-28 (×2): qty 20

## 2021-10-28 NOTE — Progress Notes (Addendum)
Daily Rounding Note  10/28/2021, 12:00 PM  LOS: 3 days   SUBJECTIVE:   Chief complaint: extensive colitis suspicious for Crohns.  Gastric ulcers.  Anemia.       Stools are loose, unformed, small volume but a bit more solid.  No blood visible in stool.   No abdominal pain. Eating very well, diet is Regular.   No fever.  Tachycardic.    OBJECTIVE:         Vital signs in last 24 hours:    Temp:  [98 F (36.7 C)-100 F (37.8 C)] 99.2 F (37.3 C) (07/18 0800) Pulse Rate:  [97-115] 111 (07/18 0800) Resp:  [18-20] 19 (07/18 0800) BP: (119-141)/(77-93) 128/86 (07/18 0800) SpO2:  [94 %-99 %] 94 % (07/18 0800) Weight:  [76.8 kg] 76.8 kg (07/18 0458) Last BM Date : 10/27/21 Filed Weights   10/25/21 0507 10/26/21 0424 10/28/21 0458  Weight: 75.3 kg 77.6 kg 76.8 kg   General: Comfortable, alert, NAD.  Does not look toxic or ill. Heart: Regular, tachycardic. Chest: No labored breathing or cough.  Lungs clear bilaterally Abdomen: Not tender, not distended.  Active bowel sounds Extremities: No CCE. Neuro/Psych: Pleasant, cooperative.  Verbal.  Intake/Output from previous day: 07/17 0701 - 07/18 0700 In: 1830 [P.O.:480; I.V.:700; IV Piggyback:650] Out: 3225 [Urine:3225]  Intake/Output this shift: Total I/O In: -  Out: 450 [Urine:450]  Lab Results: Recent Labs    10/26/21 0800 10/27/21 0509 10/28/21 0418  WBC 6.1 7.2  --   HGB 9.7* 9.9* 9.1*  HCT 29.9* 30.0* 28.1*  PLT 377 392  --    BMET Recent Labs    10/26/21 0800 10/27/21 0509 10/28/21 0418  NA 138 132* 136  K 3.7 3.2* 3.4*  CL 99 101 104  CO2 25 22 25   GLUCOSE 114* 117* 96  BUN <5* <5* <5*  CREATININE 0.69 0.74 0.76  CALCIUM 8.4* 8.0* 7.8*   LFT No results for input(s): "PROT", "ALBUMIN", "AST", "ALT", "ALKPHOS", "BILITOT", "BILIDIR", "IBILI" in the last 72 hours. PT/INR No results for input(s): "LABPROT", "INR" in the last 72 hours. Hepatitis  Panel Recent Labs    10/27/21 1651  HEPBSAG NON REACTIVE    Studies/Results: No results found.  Scheduled Meds:  Chlorhexidine Gluconate Cloth  6 each Topical Daily   ciprofloxacin  250 mg Oral BID   metroNIDAZOLE  500 mg Oral Q12H   pantoprazole (PROTONIX) IV  40 mg Intravenous Q12H   potassium chloride  30 mEq Oral Q6H   sodium chloride flush  3 mL Intravenous Q12H   Continuous Infusions: PRN Meds:.acetaminophen, ondansetron **OR** ondansetron (ZOFRAN) IV, oxyCODONE, polyethylene glycol   ASSESMENT:   Lower abdominal pain, diarrhea, weight loss, bloody stools, FOBT +, odynophagia. EGD in [place] planned for 7/18 w Dr 8/18.  SIRS at arrival.  Sepsis now ruled out.  Blood cultures wo growth at 3 days.   C diff, GI path PCR panel negative.   10/24/2021 CTAP w contrast shows descending colitis.   10/27/2021 colonoscopy.  Noted poor prep.  Biopsies of multiple ulcers at distal rectum, sigmoid, descending, ascending, splenic flexure, cecum.  Visibly looks to be severe Crohn's colitis. 10/27/2021 EGD: Nonbleeding gastric ulcers, biopsied.  Normal esophagus and examined duodenum.  Several biopsies obtained of distal esophagus, stomach.  Some suspicion ulcers and may represent Crohn's. 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  HBV  surface Ab reactive. HBV Surface Ag and HBV core Ab are negative. Surgical  path pndg.    Continues on Cipro, Flagyl, now oral.  Day 4-5.  Protonix 40 IV bid in place.    Perirectal abscess.  At colonoscopy yesterday this associated with small anal fistula..  No mention of rectal abscess or fistula on CT.    Microcytic anemia.  Hgb 11.6..  9.1 over last 5 days, was 12.5 six and three weeks ago. No PRBCs to date.     Hypokalemia.    Low protein, low albumin.  R/O protein malnutrition.    Autism.   Family attentive  Urinary retention.   PLAN   Await pathology report from yesterday's EGD.      Anemia panel, prealbumin ordered for AM.     ?  When to stop abx and when to convert to po PPI?    Donald Gallagher  10/28/2021, 12:00 PM Phone [phone]   I have taken an interval history, thoroughly reviewed the chart and examined the patient. I agree with the Advanced Practitioner's note, impression and recommendations, and have recorded additional findings, impressions and recommendations below. I performed a substantive portion of this encounter (>50% time spent), including a complete performance of the medical decision making.  My additional thoughts are as follows:  I spoke with Dr. Ella Gallagher about this patient and also spoke with Donald Gallagher's mother for a while since she was at the bedside.  Donald Gallagher continues to report abdominal pain and he has diarrhea but no further bleeding since admission. He has a significant microcytic anemia and low albumin from this chronic condition.  The overall clinical picture looks almost certainly like Crohn's disease, we are awaiting final pathology to rule out other diagnoses.  However, I would be very surprised if this was some infectious or neoplastic cause.  To that end, I ordered labs in anticipation of him likely starting anti-TNF therapy soon under the direction of Dr. Levon Gallagher, his primary GI doctor in [place].  IV iron would help him, and Dr. Ella Gallagher has placed the orders.  The fistula is draining well to the buttock and surgery does not feel he needs operative intervention.  He was changed to oral antibiotics today, and I think another 10 days should be sufficient to treat that.  Naturally, it can be reexamined when he is seen in the GI office soon and further antibiotics prescribed if needed.  Lastly, I am starting him on prednisone 40 mg once daily, and this should continue until he sees GI in follow-up within 2 weeks after discharge.  Donald Gallagher Office:[phone]

## 2021-10-28 NOTE — Progress Notes (Signed)
Progress Note   Patient: Uriel Dowding YDX:[id number] DOB: [birth year] DOA: 10/24/2021     3 DOS: the patient was seen and examined on 10/28/2021   Brief hospital course: Mr. Tienda was admitted to the hospital with the working diagnosis of upper GI bleed.   41 yo male with the past medical history of Autism, who presented with hematochezia, dark blood with clots x2, associated with abdominal pain. Very poor oral intake over last several weeks with 35 lbs weigh loss. As outpatient he was seen by GI for a prior episode of hematemesis and had a EGD scheduled. He has been taking proton pump inhibitor as outpatient. On his initial physical examination his temp was 102.2, blood pressure 120/88, HR 130, RR 30 and 02 saturation 99% on room air. Lungs were clear to auscultation, heart with S1 and S2 present and rhythmic, abdomen with diffuse tenderness to palpation, soft with no masses, positive bowel movements, no lower extremity edema.   Na 136, K 3,7 Cl 97, bicarbonate 23, glucose 105 bun 5 cr 0.79 Wbc 8,2 hgb 11.6 plt 538  INR 1.4   EKG 143 bpm, normal axis, prolonneg qtc 549, sinus rhythm, with no significant ST segment or T wave changes.   CT abdomen and pelvis with colitis in the descending colon.  Patient was placed on IV antibiotics and IV pantoprazole and GI was consulted.  Had urinary retention and foley catheter has been placed.   07/17 EGD with non bleeding gastric ulcers Colonoscopy with multiple ulcers, possible Chron's disease.   07/18 antibiotic therapy changed to po and foley catheter has been removed. Pending final recommendations from GI.   Assessment and Plan: * Gastrointestinal bleed Patient with upper and lower GI bleed.  His hgb is stable with no signs of current bleeding  hgb is 9,9 today,   EGD with non bleeding gastric ulcers, with no stigmata of bleeding. Possible Crohn's disease.  Colonoscopy with multiple ulcers in the distal rectum, in the sigmoid colon and  descending colon at the splenic flexure and in the proximal ascending colon and in the cecum.  Possible Crohn's disease.   Plan to continue conservative medical therapy with ciprofloxacin and metronidazole (chanegd to oral, plan to complete 10 days). Pending biopsies.  Patient tolerating po well with no significant abdominal pain. Continue with pantoprazole, change to po.   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  Plan to follow up as outpatient.     SIRS (systemic inflammatory response syndrome) (HCC) Inflammation due to colitis, sepsis ruled out.   Autistic disorder Minimally verbal Frequent nursing rounding Minimizing uncomfortable stimuli Encouraging family to remain at bedside is much as possible  Acute urinary retention Removed foley cathter today for voiding trial. Out of bed to chair tid with meals and ambulate in the hallway.   Hypokalemia Hyponatremia.   Renal function stable with serum cr at 0,76, K is 3,4 and serum bicarbonate at 25. Plan to continue K correction with Kcl and follow up renal function and electrolytes in am.  Patient is tolerating po well.         Subjective: Patient with no abdominal pain, no further melena or hematochezia, no hematemesis and tolerating po well.   Physical Exam: Vitals:   10/27/21 2335 10/28/21 0458 10/28/21 0535 10/28/21 0800  BP: (!) 141/81  122/77 128/86  Pulse: (!) 110  (!) 102 (!) 111  Resp: 18  18 19   Temp: 99.4 F (37.4 C)  98.6 F (37 C) 99.2 F (37.3 C)  TempSrc: Oral  Oral Oral  SpO2: 98%  97% 94%  Weight:  76.8 kg    Height:       Neurology awake and alert ENT with no pallor Cardiovascular with S1 and S2 present and rhythmic,  Respiratory with no rales or wheezing Abdomen not distended, not tender to superficial palpation No lower extremity edema  Data Reviewed:    Family Communication: I spoke with patient's mother at the bedside, we  talked in detail about patient's condition, plan of care and prognosis and all questions were addressed.   Disposition: Status is: Inpatient Remains inpatient appropriate because: pending final GI recommendations   Planned Discharge Destination: Home     Author: Coralie Keens, MD 10/28/2021 1:26 PM  For on call review [email or web address].

## 2021-10-29 ENCOUNTER — Other Ambulatory Visit (HOSPITAL_COMMUNITY): Payer: Self-pay

## 2021-10-29 DIAGNOSIS — F840 Autistic disorder: Secondary | ICD-10-CM | POA: Diagnosis not present

## 2021-10-29 DIAGNOSIS — K922 Gastrointestinal hemorrhage, unspecified: Secondary | ICD-10-CM | POA: Diagnosis not present

## 2021-10-29 DIAGNOSIS — K921 Melena: Secondary | ICD-10-CM

## 2021-10-29 DIAGNOSIS — R338 Other retention of urine: Secondary | ICD-10-CM | POA: Diagnosis not present

## 2021-10-29 DIAGNOSIS — K5010 Crohn's disease of large intestine without complications: Principal | ICD-10-CM

## 2021-10-29 DIAGNOSIS — E876 Hypokalemia: Secondary | ICD-10-CM | POA: Diagnosis not present

## 2021-10-29 DIAGNOSIS — E43 Unspecified severe protein-calorie malnutrition: Secondary | ICD-10-CM

## 2021-10-29 LAB — CULTURE, BLOOD (ROUTINE X 2)
Culture: NO GROWTH
Culture: NO GROWTH
Special Requests: ADEQUATE

## 2021-10-29 LAB — BASIC METABOLIC PANEL
Anion gap: 9 (ref 5–15)
BUN: <5 mg/dL — ABNORMAL LOW (ref 6–20)
CO2: 25 mmol/L (ref 22–32)
Calcium: 8.4 mg/dL — ABNORMAL LOW (ref 8.9–10.3)
Chloride: 103 mmol/L (ref 98–111)
Creatinine, Ser: 0.69 mg/dL (ref 0.61–1.24)
GFR, Estimated: >60 mL/min (ref >60)
Glucose, Bld: 143 mg/dL — ABNORMAL HIGH (ref 70–99)
Potassium: 3.9 mmol/L (ref 3.5–5.1)
Sodium: 137 mmol/L (ref 135–145)

## 2021-10-29 LAB — QUANTIFERON-TB GOLD PLUS: QuantiFERON-TB Gold Plus: NEGATIVE

## 2021-10-29 LAB — MAGNESIUM: Magnesium: 2 mg/dL (ref 1.7–2.4)

## 2021-10-29 LAB — QUANTIFERON-TB GOLD PLUS (RQFGPL)
QuantiFERON Mitogen Value: 7.5 IU/mL
QuantiFERON Nil Value: 0.09 IU/mL
QuantiFERON TB1 Ag Value: 0.09 IU/mL
QuantiFERON TB2 Ag Value: 0.08 IU/mL

## 2021-10-29 LAB — RETICULOCYTES
Immature Retic Fract: 29.9 % — ABNORMAL HIGH (ref 2.3–15.9)
RBC.: 4.19 MIL/uL — ABNORMAL LOW (ref 4.22–5.81)
Retic Count, Absolute: 81.7 10*3/uL (ref 19.0–186.0)
Retic Ct Pct: 2 % (ref 0.4–3.1)

## 2021-10-29 LAB — IRON AND TIBC
Iron: 30 ug/dL — ABNORMAL LOW (ref 45–182)
Saturation Ratios: 26 % (ref 17.9–39.5)
TIBC: 118 ug/dL — ABNORMAL LOW (ref 250–450)
UIBC: 88 ug/dL

## 2021-10-29 LAB — PREALBUMIN: Prealbumin: 5 mg/dL — ABNORMAL LOW (ref 18–38)

## 2021-10-29 LAB — FERRITIN: Ferritin: 621 ng/mL — ABNORMAL HIGH (ref 24–336)

## 2021-10-29 LAB — FOLATE: Folate: 4.6 ng/mL — ABNORMAL LOW (ref >5.9)

## 2021-10-29 LAB — VITAMIN B12: Vitamin B-12: 797 pg/mL (ref 180–914)

## 2021-10-29 MED ORDER — FERROUS SULFATE 325 (65 FE) MG PO TABS
325.0000 mg | ORAL_TABLET | Freq: Every day | ORAL | 0 refills | Status: AC
Start: 2021-10-30 — End: 2021-11-14
  Filled 2021-10-29: qty 15, 15d supply, fill #0

## 2021-10-29 MED ORDER — PREDNISONE 20 MG PO TABS
40.0000 mg | ORAL_TABLET | Freq: Every day | ORAL | 0 refills | Status: DC
  Filled 2021-10-29: qty 60, 30d supply, fill #0

## 2021-10-29 MED ORDER — METRONIDAZOLE 500 MG PO TABS
500.0000 mg | ORAL_TABLET | Freq: Two times a day (BID) | ORAL | 0 refills | Status: AC
  Filled 2021-10-29: qty 20, 10d supply, fill #0

## 2021-10-29 MED ORDER — CIPROFLOXACIN HCL 250 MG PO TABS
250.0000 mg | ORAL_TABLET | Freq: Two times a day (BID) | ORAL | 0 refills | Status: AC
  Filled 2021-10-29: qty 20, 10d supply, fill #0

## 2021-10-29 MED ORDER — FERROUS SULFATE 325 (65 FE) MG PO TABS: 325.0000 mg | ORAL_TABLET | Freq: Every day | ORAL | Status: DC

## 2021-10-29 NOTE — Assessment & Plan Note (Signed)
Continue with nutritional supplementation.  ?

## 2021-10-29 NOTE — Progress Notes (Signed)
Entered patient room to perform bladder scan at approximately 0400. Patient gown wet with urine at this time. Bladder scan revealed a consistent residual volume. Purewick applied to patient to collect urinary output if retention is resolved.

## 2021-10-29 NOTE — TOC Transition Note (Signed)
Transition of Care [facility]) - CM/SW Discharge Note   Patient Details  Name: Donald Gallagher MRN: [id number] Date of Birth: 10-28-1980  [facility] CM/SW Contact:  Leone Haven, RN Phone Number: 10/29/2021, 11:57 AM   Clinical Narrative:    Patient is for dc today, has no needs.          Patient Goals and CMS Choice        Discharge Placement                       Discharge Plan and Services                                     Social Determinants of Health (SDOH) Interventions     Readmission Risk Interventions     No data to display

## 2021-10-29 NOTE — Discharge Summary (Addendum)
Physician Discharge Summary   Patient: Donald Gallagher MRN: [id number] DOB: 08-08-1980  Admit date:     10/24/2021  Discharge date: 10/29/21  Discharge Physician: York Ram Aiyanah Kalama   PCP: Junie Spencer, FNP   Recommendations at discharge:    Patient will continue antibiotic therapy for the next 10 days Continue prednisone 40 mg daily until follow up with GI as outpatient. Patient had one dose of IV iron during this hospitalization and will continue with oral iron per GI recommendations.  Patient failed urinary voiding trial, patient will go home with foley cathter in place. Please attempt voiding trial as outpatient.   I spoke with patient's mother  at the bedside, we talked in detail about patient's condition, plan of care and prognosis and all questions were addressed.   Discharge Diagnoses: Principal Problem:   Gastrointestinal bleed Active Problems:   SIRS (systemic inflammatory response syndrome) (HCC)   Autistic disorder   Acute urinary retention   Hypokalemia   Colitis   Protein-calorie malnutrition, severe  Resolved Problems:   * No resolved hospital problems. [facility] Course: Mr. Penn was admitted to the hospital with the working diagnosis of upper GI bleed, new diagnosis of presumptive Crohn's disease.   41 yo male with the past medical history of Autism, who presented with hematochezia, dark blood with clots x2, associated with abdominal pain. Very poor oral intake over last several weeks with 35 lbs weigh loss. As outpatient he was seen by GI for a prior episode of hematemesis and had a EGD scheduled. He has been taking proton pump inhibitor as outpatient. On his initial physical examination his temp was 102.2, blood pressure 120/88, HR 130, RR 30 and 02 saturation 99% on room air. Lungs were clear to auscultation, heart with S1 and S2 present and rhythmic, abdomen with diffuse tenderness to palpation, soft with no masses, positive bowel movements, no lower  extremity edema.   Na 136, K 3,7 Cl 97, bicarbonate 23, glucose 105 bun 5 cr 0.79 Wbc 8,2 hgb 11.6 plt 538  INR 1.4   EKG 143 bpm, normal axis, prolonneg qtc 549, sinus rhythm, with no significant ST segment or T wave changes.   CT abdomen and pelvis with colitis in the descending colon.  Patient was placed on IV antibiotics and IV pantoprazole and GI was consulted.  Had urinary retention and foley catheter has been placed.   07/17 EGD with non bleeding gastric ulcers Colonoscopy with multiple ulcers, possible Crohn's disease.   07/18 antibiotic therapy changed to po and foley catheter has been removed. Started on oral prednisone for presumptive Crohn's disease.  07/19 plan to follow up as outpatient.    Assessment and Plan: * Gastrointestinal bleed Patient with upper and lower GI bleed.  His hgb is stable with no signs of current bleeding   EGD with non bleeding gastric ulcers, with no stigmata of bleeding. Possible Crohn's disease.  Colonoscopy with multiple ulcers in the distal rectum, in the sigmoid colon and descending colon at the splenic flexure and in the proximal ascending colon and in the cecum.  Possible Crohn's disease.   Pending biopsies.  Patient tolerating po well with no significant abdominal pain. Continue with pantoprazole.  Plan to continue oral antibiotic therapy for 10 more days, with ciprofloxacin and metronidazole. Continue prednisone 40 mg daily Follow up with Gi in 2 weeks Patient will likely need further advance therapy for Crohn's if diagnose is confirmed.   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  Plan to follow up as outpatient per general surgery recommendations.   Iron panel with serum iron 30, transferrin saturation 26, TIBC 118 and ferritin 621. Patient had one dose of IV iron and will continue with oral iron to prevent iron deficiency.   Follow up hgb is 9,1 consistent with  possible acute anemia in combination of anemia of chronic disease.  Follow up cell count as outpatient.   SIRS (systemic inflammatory response syndrome) (HCC) Inflammation due to colitis, sepsis ruled out.   Autistic disorder Minimally verbal Frequent nursing rounding Minimizing uncomfortable stimuli Encouraging family to remain at bedside is much as possible  Acute urinary retention Removed foley cathter but unfortunately patient had recurrent urinary retention. Will go home with foley catheter and plan for voiding trial as outpatient.   Hypokalemia Hyponatremia.   Patient had Kcl supplementation with improvement in his hypokalemia.  At the time of his discharge his renal function has a serum cr of 0,69, K is 3.9 and serum bicarbonate at 25.   Protein-calorie malnutrition, severe Continue with nutritional supplementation.          Consultants: GI  Procedures performed: upper endoscopy and colonoscopy   Disposition: Home Diet recommendation:  Regular diet DISCHARGE MEDICATION: Allergies as of 10/29/2021   No Known Allergies      Medication List     STOP taking these medications    amoxicillin-clavulanate 875-125 MG tablet Commonly known as: AUGMENTIN       TAKE these medications    acetaminophen 500 MG tablet Commonly known as: TYLENOL Take 1,000 mg by mouth every 6 (six) hours as needed for fever.   bismuth subsalicylate 262 MG/15ML suspension Commonly known as: PEPTO BISMOL Take 30 mLs by mouth every 6 (six) hours as needed for indigestion or diarrhea or loose stools.   ciprofloxacin 250 MG tablet Commonly known as: CIPRO Take 1 tablet (250 mg total) by mouth 2 (two) times daily for 10 days.   ferrous sulfate 325 (65 FE) MG tablet Take 1 tablet (325 mg total) by mouth daily with breakfast for 15 days. Start taking on: October 30, 2021   magic mouthwash (nystatin, diphenhydrAMINE, alum & mag hydroxide) suspension mixture Swish and swallow 5 mLs 4  (four) times daily.   metroNIDAZOLE 500 MG tablet Commonly known as: FLAGYL Take 1 tablet (500 mg total) by mouth every 12 (twelve) hours for 10 days.   pantoprazole 40 MG tablet Commonly known as: Protonix Take 1 tablet (40 mg total) by mouth daily.   predniSONE 20 MG tablet Commonly known as: DELTASONE Take 2 tablets (40 mg total) by mouth daily. Start taking on: October 30, 2021        Follow-up Information     Andria MeuseWhite, Christopher M, MD Follow up on 11/26/2021.   Specialties: General Surgery, Colon and Rectal Surgery Why: 11:20am,, Arrive 30 minutes prior to your appointment time, Please bring your insurance card and photo ID.  This is surgical follow up for your perirectal fistula. Contact information: [address] [place] [phone]                Discharge Exam: Filed Weights   10/26/21 0424 10/28/21 0458 10/29/21 0335  Weight: 77.6 kg 76.8 kg 74.3 kg   BP (!) 124/101 (BP Location: Left Arm)   Pulse 100   Temp 98.1 F (36.7 C) (Oral)   Resp 19   Ht 5\' 6"  (1.676 m)  Wt 74.3 kg   SpO2 99%   BMI 26.44 kg/m   Patient with improved abdominal pain, no nausea or vomiting. No hematochezia   Neurology awake and alert ENT With on pallor Cardiovascular with S1 and S2 present and rhythmic Respiratory with no rales or wheezing Abdomen not distended or tender to superficial palpation  No lower extremity edema   Condition at discharge: stable  The results of significant diagnostics from this hospitalization (including imaging, microbiology, ancillary and laboratory) are listed below for reference.   Imaging Studies: CT ABDOMEN PELVIS W CONTRAST  Result Date: 10/25/2021 CLINICAL DATA:  Abdominal pain. EXAM: CT ABDOMEN AND PELVIS WITH CONTRAST TECHNIQUE: Multidetector CT imaging of the abdomen and pelvis was performed using the standard protocol following bolus administration of intravenous contrast. RADIATION DOSE REDUCTION:  This exam was performed according to the departmental dose-optimization program which includes automated exposure control, adjustment of the mA and/or kV according to patient size and/or use of iterative reconstruction technique. CONTRAST:  OMNIPAQUE IOHEXOL 300 MG/ML  SOLN COMPARISON:  CT abdomen pelvis dated 10/07/2021. FINDINGS: Lower chest: The visualized lung bases are clear. No intra-abdominal free air or free fluid. Hepatobiliary: No focal liver abnormality is seen. No gallstones, gallbladder wall thickening, or biliary dilatation. Pancreas: Unremarkable. No pancreatic ductal dilatation or surrounding inflammatory changes. Spleen: Normal in size without focal abnormality. Adrenals/Urinary Tract: Adrenal glands are unremarkable. Kidneys are normal, without renal calculi, focal lesion, or hydronephrosis. Bladder is unremarkable. Stomach/Bowel: There is inflammatory changes and thickening of the descending colon consistent with colitis. Clinical correlation is recommended. There is no bowel obstruction. The appendix is normal. Vascular/Lymphatic: The abdominal aorta and IVC are unremarkable. No portal venous gas. There is no adenopathy. Reproductive: The prostate and seminal vesicles are grossly unremarkable. No pelvic mass. Other: None Musculoskeletal: Bilateral L5 pars defects. No listhesis. No acute osseous pathology. IMPRESSION: Colitis of the descending colon. No bowel obstruction. Normal appendix. Electronically Signed   By: Elgie Collard M.D.   On: 10/25/2021 00:51   DG Chest Port 1 View  Result Date: 10/24/2021 CLINICAL DATA:  Weakness EXAM: PORTABLE CHEST 1 VIEW COMPARISON:  09/14/2021 FINDINGS: Heart and mediastinal contours are within normal limits. No focal opacities or effusions. No acute bony abnormality. IMPRESSION: No active disease. Electronically Signed   By: Charlett Nose M.D.   On: 10/24/2021 19:49   CT ABDOMEN PELVIS W CONTRAST  Result Date: 10/07/2021 CLINICAL DATA:   Abdominal pain, acute, nonlocalized EXAM: CT ABDOMEN AND PELVIS WITH CONTRAST TECHNIQUE: Multidetector CT imaging of the abdomen and pelvis was performed using the standard protocol following bolus administration of intravenous contrast. RADIATION DOSE REDUCTION: This exam was performed according to the departmental dose-optimization program which includes automated exposure control, adjustment of the mA and/or kV according to patient size and/or use of iterative reconstruction technique. CONTRAST:  OMNIPAQUE IOHEXOL 300 MG/ML  SOLN COMPARISON:  None Available. FINDINGS: Lower chest: No acute abnormality. Hepatobiliary: No focal liver abnormality is seen. The gallbladder is unremarkable. Pancreas: Unremarkable. No pancreatic ductal dilatation or surrounding inflammatory changes. Spleen: Normal in size without focal abnormality. Adrenals/Urinary Tract: Adrenal glands are unremarkable. No hydronephrosis or nephrolithiasis. The bladder is moderately distended. Stomach/Bowel: The stomach is within normal limits. There is no evidence of bowel obstruction.The appendix is normal. Vascular/Lymphatic: No significant vascular findings are present. No enlarged abdominal or pelvic lymph nodes. Reproductive: Mildly enlarged prostate gland. Other: No abdominal wall hernia or abnormality. No abdominopelvic ascites. Musculoskeletal: No acute or significant osseous findings.  IMPRESSION: No acute abdominopelvic abnormality.  Normal appendix. Electronically Signed   By: Caprice Renshaw M.D.   On: 10/07/2021 14:41    Microbiology: Results for orders placed or performed during the hospital encounter of 10/24/21  Culture, blood (Routine x 2)     Status: None   Collection Time: 10/24/21  5:23 PM   Specimen: BLOOD LEFT HAND  Result Value Ref Range Status   Specimen Description BLOOD LEFT HAND  Final   Special Requests   Final    BOTTLES DRAWN AEROBIC AND ANAEROBIC Blood Culture results may not be optimal due to an inadequate  volume of blood received in culture bottles   Culture   Final    NO GROWTH 5 DAYS Performed at [facility], [address], [place], [place] [address]    Report Status 10/29/2021 FINAL  Final  Culture, blood (Routine x 2)     Status: None   Collection Time: 10/24/21  6:19 PM   Specimen: BLOOD  Result Value Ref Range Status   Specimen Description BLOOD SITE NOT SPECIFIED  Final   Special Requests   Final    BOTTLES DRAWN AEROBIC AND ANAEROBIC Blood Culture adequate volume   Culture   Final    NO GROWTH 5 DAYS Performed at [facility], [address] [place], [place] [address]    Report Status 10/29/2021 FINAL  Final  SARS Coronavirus 2 by RT PCR (hospital order, performed in [facility] hospital lab) *cepheid single result test* Anterior Nasal Swab     Status: None   Collection Time: 10/24/21  8:21 PM   Specimen: Anterior Nasal Swab  Result Value Ref Range Status   SARS Coronavirus 2 by RT PCR NEGATIVE NEGATIVE Final    Comment: (NOTE) SARS-CoV-2 target nucleic acids are NOT DETECTED.  The SARS-CoV-2 RNA is generally detectable in upper and lower respiratory specimens during the acute phase of infection. The lowest concentration of SARS-CoV-2 viral copies this assay can detect is 250 copies / mL. A negative result does not preclude SARS-CoV-2 infection and should not be used as the sole basis for treatment or other patient management decisions.  A negative result may occur with improper specimen collection / handling, submission of specimen other than nasopharyngeal swab, presence of viral mutation(s) within the areas targeted by this assay, and inadequate number of viral copies (<250 copies / mL). A negative result must be combined with clinical observations, patient history, and epidemiological information.  Fact Sheet for Patients:   RoadLapTop.co.za  Fact Sheet for Healthcare  Providers: http://kim-miller.com/  This test is not yet approved or  cleared by the [place] FDA and has been authorized for detection and/or diagnosis of SARS-CoV-2 by FDA under an Emergency Use Authorization (EUA).  This EUA will remain in effect (meaning this test can be used) for the duration of the COVID-19 declaration under Section 564(b)(1) of the Act, 21 U.S.C. section 360bbb-3(b)(1), unless the authorization is terminated or revoked sooner.  Performed at [facility], [address], [place], [place] [address]   C Difficile Quick Screen w PCR reflex     Status: None   Collection Time: 10/25/21  7:42 AM   Specimen: STOOL  Result Value Ref Range Status   C Diff antigen NEGATIVE NEGATIVE Final   C Diff toxin NEGATIVE NEGATIVE Final   C Diff interpretation No C. difficile detected.  Final    Comment: Performed at [facility], [address], [place], [place] [address]  Gastrointestinal Panel by PCR , Stool     Status: None   Collection Time: 10/25/21  7:43 AM   Specimen: STOOL  Result Value Ref Range Status   Campylobacter species NOT DETECTED NOT DETECTED Final   Plesimonas shigelloides NOT DETECTED NOT DETECTED Final   Salmonella species NOT DETECTED NOT DETECTED Final   Yersinia enterocolitica NOT DETECTED NOT DETECTED Final   Vibrio species NOT DETECTED NOT DETECTED Final   Vibrio cholerae NOT DETECTED NOT DETECTED Final   Enteroaggregative E coli (EAEC) NOT DETECTED NOT DETECTED Final   Enteropathogenic E coli (EPEC) NOT DETECTED NOT DETECTED Final   Enterotoxigenic E coli (ETEC) NOT DETECTED NOT DETECTED Final   Shiga like toxin producing E coli (STEC) NOT DETECTED NOT DETECTED Final   Shigella/Enteroinvasive E coli (EIEC) NOT DETECTED NOT DETECTED Final   Cryptosporidium NOT DETECTED NOT DETECTED Final   Cyclospora cayetanensis NOT DETECTED NOT DETECTED Final   Entamoeba histolytica NOT DETECTED NOT DETECTED Final   Giardia  lamblia NOT DETECTED NOT DETECTED Final   Adenovirus F40/41 NOT DETECTED NOT DETECTED Final   Astrovirus NOT DETECTED NOT DETECTED Final   Norovirus GI/GII NOT DETECTED NOT DETECTED Final   Rotavirus A NOT DETECTED NOT DETECTED Final   Sapovirus (I, II, IV, and V) NOT DETECTED NOT DETECTED Final    Comment: Performed at [facility], [address] [place], [place] [address]    Labs: CBC: Recent Labs  Lab 10/24/21 1743 10/25/21 0342 10/25/21 1026 10/26/21 0800 10/27/21 0509 10/28/21 0418  WBC 8.2 7.9 6.9 6.1 7.2  --   NEUTROABS 5.0  --   --   --   --   --   HGB 11.6* 9.5* 9.2* 9.7* 9.9* 9.1*  HCT 37.4* 29.7* 28.4* 29.9* 30.0* 28.1*  MCV 77.3* 74.3* 73.8* 72.9* 73.9*  --   PLT 538* 420* 401* 377 392  --    Basic Metabolic Panel: Recent Labs  Lab 10/25/21 0342 10/26/21 0800 10/27/21 0509 10/28/21 0418 10/29/21 0344  NA 135 138 132* 136 137  K 3.5 3.7 3.2* 3.4* 3.9  CL 104 99 101 104 103  CO2 24 25 22 25 25   GLUCOSE 125* 114* 117* 96 143*  BUN 6 <5* <5* <5* <5*  CREATININE 0.77 0.69 0.74 0.76 0.69  CALCIUM 8.3* 8.4* 8.0* 7.8* 8.4*  MG 1.7  --   --   --  2.0   Liver Function Tests: Recent Labs  Lab 10/24/21 1743 10/25/21 0342  AST 21 9*  ALT 12 9  ALKPHOS 66 53  BILITOT 0.6 0.9  PROT 7.7 6.2*  ALBUMIN 2.5* 2.0*   CBG: Recent Labs  Lab 10/26/21 1606 10/26/21 2105 10/27/21 0719 10/27/21 1109 10/27/21 1637  GLUCAP 99 120* 114* 119* 118*    Discharge time spent: greater than 30 minutes.  Signed: 10/29/21, MD [facility] 10/29/2021

## 2021-10-29 NOTE — TOC Transition Note (Addendum)
Transition of Care [facility]) - CM/SW Discharge Note   Patient Details  Name: Donald Gallagher MRN: [id number] Date of Birth: [birth year]  [facility] CM/SW Contact:  Leone Haven, RN Phone Number: 10/29/2021, 12:04 PM   Clinical Narrative:    NCM spoke with patient's mom who is his caretaker at home.  She states he will be going home by car with her and that he can ambulate.  MD states patient will be going home with foley, will add HHRN.  NCM offered choice, mom has no preference.  NCM made referral to [facility] with [facility]. She is able to take referral. Soc will begin 24 to 48 hrs post dc.          Patient Goals and CMS Choice        Discharge Placement                       Discharge Plan and Services                                     Social Determinants of Health (SDOH) Interventions     Readmission Risk Interventions     No data to display

## 2021-10-29 NOTE — Progress Notes (Signed)
[facility] Progress Note  Chief Complaint: Crohn's colitis, hematochezia  History:  No clinical events since last saw Kendre yesterday, however he did pass urine without the need for catheter or significant residual on subsequent scan. He reports abdominal pain that it is difficult to characterize, pointing to several different areas.  He is eating well and nearly finished his breakfast tray except for the bacon. No rectal bleeding for days. Mother is at the bedside   Objective:   Current Facility-Administered Medications:    acetaminophen (TYLENOL) tablet 650 mg, 650 mg, Oral, Q4H PRN, [name], MD, 650 mg at 10/25/21 2318   Chlorhexidine Gluconate Cloth 2 % PADS 6 each, 6 each, Topical, Daily, Danis, Starr Lake III, MD, 6 each at 10/28/21 0805   ciprofloxacin (CIPRO) tablet 250 mg, 250 mg, Oral, BID, Arrien, York Ram, MD, 250 mg at 10/28/21 1937   ferric gluconate (FERRLECIT) 250 mg in sodium chloride 0.9 % 250 mL IVPB, 250 mg, Intravenous, Daily, Arrien, York Ram, MD, Last Rate: 135 mL/hr at 10/28/21 1721, 250 mg at 10/28/21 1721   metroNIDAZOLE (FLAGYL) tablet 500 mg, 500 mg, Oral, Q12H, Arrien, York Ram, MD, 500 mg at 10/28/21 2111   ondansetron (ZOFRAN) tablet 4 mg, 4 mg, Oral, Q6H PRN **OR** ondansetron (ZOFRAN) injection 4 mg, 4 mg, Intravenous, Q6H PRN, [name], MD, 4 mg at 10/27/21 5784   oxyCODONE (Oxy IR/ROXICODONE) immediate release tablet 5 mg, 5 mg, Oral, Q6H PRN, Danis, Starr Lake III, MD   pantoprazole (PROTONIX) EC tablet 40 mg, 40 mg, Oral, Daily, Arrien, York Ram, MD, 40 mg at 10/28/21 1937   polyethylene glycol (MIRALAX / GLYCOLAX) packet 17 g, 17 g, Oral, Daily PRN, Danis, Starr Lake III, MD   predniSONE (DELTASONE) tablet 40 mg, 40 mg, Oral, Daily, Danis, Starr Lake III, MD, 40 mg at 10/28/21 1717   sodium chloride flush (NS) 0.9 % injection 3 mL, 3 mL, Intravenous, Q12H, Danis, Starr Lake III, MD, 3 mL at 10/28/21 0807   ferric  gluconate (FERRLECIT) IVPB 250 mg (10/28/21 1721)     Vital signs in last 24 hrs: Vitals:   10/28/21 1952 10/29/21 0335  BP: 129/90 (!) 135/94  Pulse: (!) 114 96  Resp: 18   Temp: 99.2 F (37.3 C)   SpO2: 97% 97%    Intake/Output Summary (Last 24 hours) at 10/29/2021 0853 Last data filed at 10/28/2021 2200 Gross per 24 hour  Intake 920 ml  Output 850 ml  Net 70 ml     Physical Exam  Cardiac: RRR without murmurs, S1S2 heard, no peripheral edema Pulm: clear to auscultation bilaterally, normal RR and effort noted Abdomen: soft, possible mild scattered tenderness, with active bowel sounds. No guarding or palpable hepatosplenomegaly  Recent Labs:     Latest Ref Rng & Units 10/28/2021    4:18 AM 10/27/2021    5:09 AM 10/26/2021    8:00 AM  CBC  WBC 4.0 - 10.5 K/uL  7.2  6.1   Hemoglobin 13.0 - 17.0 g/dL 9.1  9.9  9.7   Hematocrit 39.0 - 52.0 % 28.1  30.0  29.9   Platelets 150 - 400 K/uL  392  377     Recent Labs  Lab 10/24/21 1743  INR 1.4*      Latest Ref Rng & Units 10/29/2021    3:44 AM 10/28/2021    4:18 AM 10/27/2021    5:09 AM  CMP  Glucose 70 - 99 mg/dL  143  96  117   BUN 6 - 20 mg/dL <5  <5  <5   Creatinine 0.61 - 1.24 mg/dL 1.65  5.37  4.82   Sodium 135 - 145 mmol/L 137  136  132   Potassium 3.5 - 5.1 mmol/L 3.9  3.4  3.2   Chloride 98 - 111 mmol/L 103  104  101   CO2 22 - 32 mmol/L 25  25  22    Calcium 8.9 - 10.3 mg/dL 8.4  7.8  8.0      Radiologic studies:   Assessment & Plan  Assessment: Severe Crohn's colitis, biopsies pending to confirm, but overall clinical picture most consistent with that. Anemia of chronic blood loss and chronic disease Abdominal pain, difficult to characterize, related to this new diagnosis Rectal pain and bleeding, pain perhaps better, bleeding resolved.  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 , his primary GI physician in [place].  He replied this morning  having received a message with plans to see Channing within 2 weeks after follow-up.  Prednisone was started last evening, IV iron ordered and he should go home on oral iron as well.  My full recommendations or treatment in yesterday's progress note.  He can go home today from my perspective.   Minerva Areola III Office: [phone]

## 2021-10-29 NOTE — Progress Notes (Addendum)
Pt Alert and oriented to self w/ cognitive needs at baseline. Admitted for GI. See noted. Pt d/c w/ steroid and PO ABX. Education provided to legal guardian on meds and follow up care. Foley cath placed for urinary retention. Education provided on foley cath. Pt adequate to discharge.

## 2021-10-30 ENCOUNTER — Telehealth: Payer: Self-pay

## 2021-10-30 NOTE — Patient Outreach (Signed)
  Care Coordination TOC Note Transition Care Management Unsuccessful Follow-up Telephone Call  Date of discharge and from where:  [facility] 10/24/21-10/29/21  Attempts:  1st Attempt  Reason for unsuccessful TCM follow-up call:  Unable to leave message as mailbox full.

## 2021-10-31 ENCOUNTER — Encounter: Payer: Self-pay | Admitting: Nurse Practitioner

## 2021-10-31 ENCOUNTER — Other Ambulatory Visit: Payer: Self-pay | Admitting: *Deleted

## 2021-10-31 ENCOUNTER — Ambulatory Visit (INDEPENDENT_AMBULATORY_CARE_PROVIDER_SITE_OTHER): Payer: Medicare Other | Admitting: Nurse Practitioner

## 2021-10-31 VITALS — BP 144/95 | HR 120 | Temp 96.9°F | Ht 66.0 in | Wt 165.8 lb

## 2021-10-31 DIAGNOSIS — L0291 Cutaneous abscess, unspecified: Secondary | ICD-10-CM | POA: Diagnosis not present

## 2021-10-31 LAB — THIOPURINE METHYLTRANSFERASE (TPMT), RBC: TPMT Activity:: 20.2 Units/mL RBC

## 2021-10-31 LAB — SURGICAL PATHOLOGY

## 2021-10-31 NOTE — Patient Outreach (Signed)
  Care Coordination TOC Note Transition Care Management Unsuccessful Follow-up Telephone Call  Date of discharge and from where:  10/29/21 [facility]  Attempts:  2nd Attempt  Reason for unsuccessful TCM follow-up call:  Unable to reach patient. Voice mailbox is full.   Blanchie Serve RN, BSN [facility] [phone] [email or web address]

## 2021-10-31 NOTE — Patient Instructions (Signed)
Skin Abscess  A skin abscess is an infected area on or under your skin that contains a collection of pus and other material. An abscess may also be called a furuncle, carbuncle, or boil. An abscess can occur in or on almost any part of your body. Some abscesses break open (rupture) on their own. Most continue to get worse unless they are treated. The infection can spread deeper into the body and eventually into your blood, which can make you feel ill. Treatment usually involves draining the abscess. What are the causes? An abscess occurs when germs, like bacteria, pass through your skin and cause an infection. This may be caused by: A scrape or cut on your skin. A puncture wound through your skin, including a needle injection or insect bite. Blocked oil or sweat glands. Blocked and infected hair follicles. A cyst that forms beneath your skin (sebaceous cyst) and becomes infected. What increases the risk? This condition is more likely to develop in people who: Have a weak body defense system (immune system). Have diabetes. Have dry and irritated skin. Get frequent injections or use illegal IV drugs. Have a foreign body in a wound, such as a splinter. Have problems with their lymph system or veins. What are the signs or symptoms? Symptoms of this condition include: A painful, firm bump under the skin. A bump with pus at the top. This may break through the skin and drain. Other symptoms include: Redness surrounding the abscess site. Warmth. Swelling of the lymph nodes (glands) near the abscess. Tenderness. A sore on the skin. How is this diagnosed? This condition may be diagnosed based on: A physical exam. Your medical history. A sample of pus. This may be used to find out what is causing the infection. Blood tests. Imaging tests, such as an ultrasound, CT scan, or MRI. How is this treated? A small abscess that drains on its own may not need treatment. Treatment for larger abscesses  may include: Moist heat or heat pack applied to the area several times a day. A procedure to drain the abscess (incision and drainage). Antibiotic medicines. For a severe abscess, you may first get antibiotics through an IV and then change to antibiotics by mouth. Follow these instructions at home: Medicines  Take over-the-counter and prescription medicines only as told by your health care provider. If you were prescribed an antibiotic medicine, take it as told by your health care provider. Do not stop taking the antibiotic even if you start to feel better. Abscess care  If you have an abscess that has not drained, apply heat to the affected area. Use the heat source that your health care provider recommends, such as a moist heat pack or a heating pad. Place a towel between your skin and the heat source. Leave the heat on for 20-30 minutes. Remove the heat if your skin turns bright red. This is especially important if you are unable to feel pain, heat, or cold. You may have a greater risk of getting burned. Follow instructions from your health care provider about how to take care of your abscess. Make sure you: Cover the abscess with a bandage (dressing). Change your dressing or gauze as told by your health care provider. Wash your hands with soap and water before you change the dressing or gauze. If soap and water are not available, use hand sanitizer. Check your abscess every day for signs of a worsening infection. Check for: More redness, swelling, or pain. More fluid or blood. Warmth.   More pus or a bad smell. General instructions To avoid spreading the infection: Do not share personal care items, towels, or hot tubs with others. Avoid making skin contact with other people. Keep all follow-up visits as told by your health care provider. This is important. Contact a health care provider if you have: More redness, swelling, or pain around your abscess. More fluid or blood coming from  your abscess. Warm skin around your abscess. More pus or a bad smell coming from your abscess. Muscle aches. Chills or a general ill feeling. Get help right away if you: Have severe pain. See red streaks on your skin spreading away from the abscess. See redness that spreads quickly. Have a fever or chills. Summary A skin abscess is an infected area on or under your skin that contains a collection of pus and other material. A small abscess that drains on its own may not need treatment. Treatment for larger abscesses may include having a procedure to drain the abscess and taking an antibiotic. This information is not intended to replace advice given to you by your health care provider. Make sure you discuss any questions you have with your health care provider. Document Revised: 01/06/2021 Document Reviewed: 01/06/2021 Elsevier Patient Education  2023 [employer]  

## 2021-10-31 NOTE — Progress Notes (Signed)
Acute Office Visit  Subjective:     Patient ID: Donald Gallagher, male    DOB: [birth year], 41 y.o.   MRN: [id number]  Chief Complaint  Patient presents with   Recurrent Skin Infections    Boil on bottom- states it has been there a few weeks     HPI Patient is in today for abscess in anal area.  Symptoms present in the past few weeks.  Painful to touch and without drainage.  Patient denies fever, nausea, chills or signs and symptoms of infection.  Review of Systems  Constitutional: Negative.  Negative for fever, malaise/fatigue and weight loss.  HENT: Negative.    Respiratory: Negative.    Cardiovascular: Negative.   Gastrointestinal:  Negative for vomiting.  Genitourinary: Negative.   Skin: Negative.  Negative for itching and rash.       Abscess anal area  Neurological:  Negative for headaches.  All other systems reviewed and are negative.       Objective:    BP (!) 144/95   Pulse (!) 120   Temp (!) 96.9 F (36.1 C) (Temporal)   Ht 5\' 6"  (1.676 m)   Wt 165 lb 12.8 oz (75.2 kg)   BMI 26.76 kg/m  BP Readings from Last 3 Encounters:  10/31/21 (!) 144/95  10/29/21 (!) 124/101  10/15/21 126/88   Wt Readings from Last 3 Encounters:  10/31/21 165 lb 12.8 oz (75.2 kg)  10/29/21 163 lb 12.8 oz (74.3 kg)  10/15/21 169 lb 12.8 oz (77 kg)      Physical Exam Vitals and nursing note reviewed.  Constitutional:      Appearance: Normal appearance.  HENT:     Head: Normocephalic.     Right Ear: External ear normal.     Left Ear: External ear normal.     Nose: Nose normal.     Mouth/Throat:     Mouth: Mucous membranes are moist.  Eyes:     Conjunctiva/sclera: Conjunctivae normal.  Cardiovascular:     Rate and Rhythm: Normal rate and regular rhythm.     Pulses: Normal pulses.     Heart sounds: Normal heart sounds.  Pulmonary:     Effort: Pulmonary effort is normal.     Breath sounds: Normal breath sounds.  Abdominal:     General: Bowel sounds are normal.  Skin:     General: Skin is warm.     Coloration: Skin is not pale.     Findings: Abscess present.     Comments: Abscess present in anal area  Neurological:     Mental Status: He is alert.  Psychiatric:        Mood and Affect: Mood normal.        Behavior: Behavior normal.     No results found for any visits on 10/31/21.      Assessment & Plan:  Completed assessment, patient presents with abscess in anal area.  Currently on Cipro and metronidazole.  I will not put patient on doxycycline.  Advise caregiver to apply warm compresses to abscess and apply triple antibiotic cream.  When patient completes current antibiotic and symptoms are still present to return/follow-up in clinic.  Patient should use anti-inflammatory or Tylenol to help with pain. Education provided printed handouts given. Problem List Items Addressed This Visit   None Visit Diagnoses     Abscess    -  Primary       No orders of the defined types were placed in this encounter.  Return if symptoms worsen or fail to improve.  Ivy Lynn, NP

## 2021-11-03 ENCOUNTER — Other Ambulatory Visit: Payer: Self-pay | Admitting: *Deleted

## 2021-11-03 NOTE — Patient Outreach (Signed)
  Care Coordination [facility] Note Transition Care Management Follow-up Telephone Call Date of discharge and from where: 10/29/21 [facility] How have you been since you were released from the hospital? Mother states  Any questions or concerns? No  Items Reviewed: Did the pt receive and understand the discharge instructions provided? No  Medications obtained and verified? Yes  Other? No  Any new allergies since your discharge? No  Dietary orders reviewed? Yes Do you have support at home? Yes   Home Care and Equipment/Supplies: Were home health services ordered? yes If so, what is the name of the agency? [facility]  Has the agency set up a time to come to the patient's home? yes Were any new equipment or medical supplies ordered?  No What is the name of the medical supply agency? N/A Were you able to get the supplies/equipment? not applicable Do you have any questions related to the use of the equipment or supplies? No  Functional Questionnaire: (I = Independent and D = Dependent) ADLs: D  Bathing/Dressing- D  Meal Prep- D  Eating- I  Maintaining continence- D  Transferring/Ambulation- I  Managing Meds- D  Follow up appointments reviewed:  PCP Hospital f/u appt confirmed? Yes  Scheduled to see Purvis Sheffield, NP on 11/03/21 (had f/u visit today) Specialist Hospital f/u appt confirmed? Yes  Scheduled to see Dr. Cliffton Asters Surgery on 11/26/21 @ 1120. Are transportation arrangements needed? No  If their condition worsens, is the pt aware to call PCP or go to the Emergency Dept.? Yes Was the patient provided with contact information for the PCP's office or ED? Yes Was to pt encouraged to call back with questions or concerns? Yes  SDOH assessments and interventions completed:   Yes  Care Coordination Interventions Activated:  No Care Coordination Interventions:   N/A  Encounter Outcome:  Pt. Visit Completed  Blanchie Serve RN, BSN [facility] Care Management [facility] [phone] [email or web address]

## 2021-11-04 DIAGNOSIS — K921 Melena: Secondary | ICD-10-CM

## 2021-11-04 DIAGNOSIS — K5010 Crohn's disease of large intestine without complications: Principal | ICD-10-CM

## 2021-11-04 DIAGNOSIS — D500 Iron deficiency anemia secondary to blood loss (chronic): Secondary | ICD-10-CM | POA: Diagnosis not present

## 2021-11-10 ENCOUNTER — Ambulatory Visit (INDEPENDENT_AMBULATORY_CARE_PROVIDER_SITE_OTHER): Payer: Medicare Other | Admitting: Gastroenterology

## 2021-11-10 ENCOUNTER — Encounter (INDEPENDENT_AMBULATORY_CARE_PROVIDER_SITE_OTHER): Payer: Self-pay | Admitting: Gastroenterology

## 2021-11-10 DIAGNOSIS — K50113 Crohn's disease of large intestine with fistula: Secondary | ICD-10-CM | POA: Diagnosis not present

## 2021-11-10 MED ORDER — PREDNISONE 10 MG PO TABS: ORAL_TABLET | ORAL | 0 refills | Status: AC

## 2021-11-10 MED ORDER — CIPROFLOXACIN HCL 500 MG PO TABS: 500.0000 mg | ORAL_TABLET | Freq: Two times a day (BID) | ORAL | 0 refills | Status: DC

## 2021-11-10 MED ORDER — METRONIDAZOLE 500 MG PO TABS: 500.0000 mg | ORAL_TABLET | Freq: Three times a day (TID) | ORAL | 0 refills | Status: AC

## 2021-11-10 NOTE — Progress Notes (Unsigned)
Donald Gallagher, M.D. Gastroenterology & Hepatology [facility] [address] [place], [place] [address]  Primary Care Physician: Junie Spencer, FNP [address] [place] [address]  I will communicate my assessment and recommendations to the referring MD via EMR.  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 male who presents for follow up of ***  The patient was last seen on ***. At that time, the patient ***.  He is currently on prednisone 40 mg, has been on this dose since discharge from the hospital on 10/30/2021.   Patient is a poor historian due to mental disability. Mother reports that his appetite better. He has not presented any nausea or vomiting at home. Has gained a couple of lb since he left the hospital. Stool is dark but no melena or hematochezia. He is having 2 Bms per day which have better consistency compared to prior.  The patient denies having any nausea, vomiting, fever, chills, hematochezia, melena, hematemesis, abdominal distention, abdominal pain, diarrhea, jaundice, pruritus.  Last EGD: as above Last Colonoscopy: as above.  Past Medical History: Past Medical History:  Diagnosis Date   Autism     Past Surgical History: Past Surgical History:  Procedure Laterality Date   BIOPSY  10/27/2021   Procedure: BIOPSY;  Surgeon: Sherrilyn Rist, MD;  Location: [facility] ENDOSCOPY;  Service: Gastroenterology;;   COLONOSCOPY WITH PROPOFOL N/A 10/27/2021   Procedure: COLONOSCOPY WITH PROPOFOL;  Surgeon: Sherrilyn Rist, MD;  Location: [facility] ENDOSCOPY;  Service: Gastroenterology;  Laterality: N/A;   ESOPHAGOGASTRODUODENOSCOPY (EGD) WITH PROPOFOL N/A 10/27/2021   Procedure: ESOPHAGOGASTRODUODENOSCOPY (EGD) WITH PROPOFOL;  Surgeon: Sherrilyn Rist, MD;  Location: [facility] ENDOSCOPY;  Service: Gastroenterology;  Laterality: N/A;     Family History: Family History  Problem Relation Age of Onset   Diabetes Father     Social History: Social History   Tobacco Use  Smoking Status Never  Smokeless Tobacco Never   Social History   Substance and Sexual Activity  Alcohol Use No   Alcohol/week: 0.0 standard drinks of alcohol   Social History   Substance and Sexual Activity  Drug Use No    Allergies: No Known Allergies  Medications: Current Outpatient Medications  Medication Sig Dispense Refill   acetaminophen (TYLENOL) 500 MG tablet Take 1,000 mg by mouth every 6 (six) hours as needed for fever.     bismuth subsalicylate (PEPTO BISMOL) 262 MG/15ML suspension Take 30 mLs by mouth every 6 (six) hours as needed for indigestion or diarrhea or loose stools.     ferrous sulfate 325 (65 FE) MG tablet Take 1 tablet (325 mg total) by mouth daily with breakfast for 15 days. 15 tablet 0   magic mouthwash (nystatin, diphenhydrAMINE, alum & mag hydroxide) suspension mixture Swish and swallow 5 mLs 4 (four) times daily. 480 mL 1   predniSONE (DELTASONE) 20 MG tablet Take 2 tablets (40 mg total) by mouth daily. 60 tablet 0   pantoprazole (PROTONIX) 40 MG tablet Take 1 tablet (40 mg total) by mouth daily. (Patient not taking: Reported on 11/10/2021) 30 tablet 0   No current facility-administered medications for this visit.    Review of Systems: GENERAL: negative for malaise, night sweats HEENT: No changes in hearing or vision, no nose bleeds or other nasal problems. NECK: Negative for lumps, goiter, pain and significant neck swelling RESPIRATORY: Negative for cough, wheezing  CARDIOVASCULAR: Negative for chest pain, leg swelling, palpitations, orthopnea GI: SEE HPI MUSCULOSKELETAL: Negative for joint pain or swelling, back pain, and muscle pain. SKIN: Negative for lesions, rash PSYCH: Negative for sleep disturbance, mood disorder and recent psychosocial stressors. HEMATOLOGY Negative for prolonged bleeding, bruising  easily, and swollen nodes. ENDOCRINE: Negative for cold or heat intolerance, polyuria, polydipsia and goiter. NEURO: negative for tremor, gait imbalance, syncope and seizures. The remainder of the review of systems is noncontributory.   Physical Exam: BP 115/79 (BP Location: Left Arm, Patient Position: Sitting, Cuff Size: Small)   Pulse (!) 106   Ht 5\' 6"  (1.676 m)   Wt 164 lb 11.2 oz (74.7 kg)   BMI 26.58 kg/m  GENERAL: The patient is AO x3, in no acute distress. HEENT: Head is normocephalic and atraumatic. EOMI are intact. Mouth is well hydrated and without lesions. NECK: Supple. No masses LUNGS: Clear to auscultation. No presence of rhonchi/wheezing/rales. Adequate chest expansion HEART: RRR, normal s1 and s2. ABDOMEN: Soft, nontender, no guarding, no peritoneal signs, and nondistended. BS +. No masses. RECTAL EXAM: Exam limited by patient's poor cooperation.  presence of fluctuance at 9 o clock, but no frank drainage. No external lesions, normal tone, no masses, brown stool without blood. Chaperone:Leigh Ann, CMA EXTREMITIES: Without any cyanosis, clubbing, rash, lesions or edema. NEUROLOGIC: AOx3, no focal motor deficit. SKIN: no jaundice, no rashes  Imaging/Labs: as above  I personally reviewed and interpreted the available labs, imaging and endoscopic files.  Impression and Plan: Donald Gallagher is a 41 y.o. male coming for follow up of ***   All questions were answered.      45, MD Gastroenterology and Hepatology [facility]

## 2021-11-10 NOTE — Patient Instructions (Signed)
Decrease prednisone to 30 mg (three 10 mg) daily for one week, then 20 mg for one week (one 20 mg), then 10 mg (one 10 mg) for one week Start ciprofloxacin and metronidazole for 4 weeks Advance diet as tolerated

## 2021-11-25 ENCOUNTER — Telehealth: Payer: Self-pay | Admitting: Family

## 2021-11-25 ENCOUNTER — Inpatient Hospital Stay: Payer: Medicare Other | Admitting: Family

## 2021-11-27 ENCOUNTER — Encounter (INDEPENDENT_AMBULATORY_CARE_PROVIDER_SITE_OTHER): Payer: Self-pay | Admitting: Gastroenterology

## 2021-11-27 ENCOUNTER — Ambulatory Visit (INDEPENDENT_AMBULATORY_CARE_PROVIDER_SITE_OTHER): Payer: Medicare Other | Admitting: Gastroenterology

## 2021-11-27 VITALS — BP 112/82 | HR 141 | Temp 98.3°F | Ht 66.0 in | Wt 158.3 lb

## 2021-11-27 DIAGNOSIS — K50113 Crohn's disease of large intestine with fistula: Secondary | ICD-10-CM | POA: Diagnosis not present

## 2021-11-27 MED ORDER — AZATHIOPRINE 50 MG PO TABS: 50.0000 mg | ORAL_TABLET | Freq: Every day | ORAL | 3 refills | Status: AC

## 2021-11-27 NOTE — Telephone Encounter (Signed)
ok 

## 2021-11-27 NOTE — Patient Instructions (Addendum)
Start Infliximab protocol for Crohn's induction and every 8 weeks Start Imuran 50 mg qday Continue prednisone taper as instructed. Should continue prednisone 10 mg but PLEASE CALL ONCE YOU GET YOUR FIRST INFLIXIMAB INFUSION Follow up with Dr. Cliffton Asters - colorectal surgery

## 2021-11-27 NOTE — Progress Notes (Signed)
Donald Gallagher, M.D. Gastroenterology & Hepatology [facility] [address] [place], [place] [address]  Primary Care Physician: Junie Spencer, FNP [address] [place] [address]  I will communicate my assessment and recommendations to the referring MD via EMR.  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 male past medical history of autism and recent diagnosis of Crohn's disease, who presents for follow up after recent hospitalization for  Crohn's disease.  The patient was last seen on 11/10/2021. At that time, the patient was advised to start prednisone taper and to take ciprofloxacin and metronidazole for 4 weeks.  I explained to the mother that she had to make a decision regarding the biologic medication that would like to try and she should reach back to start the authorization process  He was seen by Dr. Cliffton Asters yesterday at [facility] surgery.  He discussed the possibility of performing a palliative seton placement but given his autism it was considered it would be challenging for him to keep the seton in place so no seton will be placed.  Dr. Cliffton Asters reach me as the mother thought she had an appointment until November and wanted to discuss the next steps in treatment.  Patient comes to the office with his mother and father who provide most of the history.  Mother states that she noticed on Tuesday night he had some low grade temperature but less than 100. He also had sweating associated with this. This improved with Tylenol.  She denies any other new events.  The mother denies having any nausea, vomiting, hematochezia, melena, hematemesis, abdominal distention, abdominal pain, diarrhea, jaundice, pruritus or weight loss. He is having 1-2 Bms per day. Has been eating well.  He is currently on prednisone 20 mg qday.  He was referred to  urology for further evaluation of urinary retention, as he is still using a Foley.  EGD and colonoscopy performed 10/27/2021. Esophagogastroduodenospy showed a normal esophagus (biopsies showed reactive squamous mucosa but no alteration), there was presence of few superficial gastric ulcers in the antrum with largest measuring 5 mm(biopsies negative for H. pylori or dysplasia), normal small bowel.   Colonoscopy showed a poor bowel preparation.  There was draining perianal fistula at the 9 o'clock position with an area of fluctuance of 3 cm, normal terminal ileum, multiple ulcers in the rectum, sigmoid, descending colon, ascending colon and cecum, as well as in the splenic flexure.  A distal rectal ulcer was extending to the anal canal.  Pathology from cecal ulcer showed nonspecific changes with moderately nucleated histiocytes.  Past Medical History: Past Medical History:  Diagnosis Date   Autism     Past Surgical History: Past Surgical History:  Procedure Laterality Date   BIOPSY  10/27/2021   Procedure: BIOPSY;  Surgeon: Sherrilyn Rist, MD;  Location: [facility] ENDOSCOPY;  Service: Gastroenterology;;   COLONOSCOPY WITH PROPOFOL N/A 10/27/2021   Procedure: COLONOSCOPY WITH PROPOFOL;  Surgeon: Sherrilyn Rist, MD;  Location: [facility] ENDOSCOPY;  Service: Gastroenterology;  Laterality: N/A;   ESOPHAGOGASTRODUODENOSCOPY (EGD) WITH PROPOFOL N/A 10/27/2021   Procedure: ESOPHAGOGASTRODUODENOSCOPY (EGD) WITH PROPOFOL;  Surgeon: Sherrilyn Rist, MD;  Location: [facility] ENDOSCOPY;  Service: Gastroenterology;  Laterality: N/A;    Family History: Family History  Problem Relation Age of Onset   Diabetes Father     Social History: Social History   Tobacco Use  Smoking Status Never  Smokeless Tobacco Never   Social History   Substance and Sexual Activity  Alcohol Use No   Alcohol/week: 0.0 standard drinks of alcohol   Social History   Substance and Sexual Activity  Drug Use No    Allergies: No  Known Allergies  Medications: Current Outpatient Medications  Medication Sig Dispense Refill   acetaminophen (TYLENOL) 500 MG tablet Take 1,000 mg by mouth every 6 (six) hours as needed for fever.     bismuth subsalicylate (PEPTO BISMOL) 262 MG/15ML suspension Take 30 mLs by mouth every 6 (six) hours as needed for indigestion or diarrhea or loose stools.     metroNIDAZOLE (FLAGYL) 250 MG tablet Take by mouth 3 (three) times daily. Take 2 tid     predniSONE (DELTASONE) 10 MG tablet Take 3 tablets (30 mg total) by mouth daily with breakfast for 7 days, THEN 1 tablet (10 mg total) daily with breakfast for 21 days. 42 tablet 0   ferrous sulfate 325 (65 FE) MG tablet Take 1 tablet (325 mg total) by mouth daily with breakfast for 15 days. (Patient not taking: Reported on 11/27/2021) 15 tablet 0   magic mouthwash (nystatin, diphenhydrAMINE, alum & mag hydroxide) suspension mixture Swish and swallow 5 mLs 4 (four) times daily. (Patient not taking: Reported on 11/27/2021) 480 mL 1   pantoprazole (PROTONIX) 40 MG tablet Take 1 tablet (40 mg total) by mouth daily. (Patient not taking: Reported on 11/27/2021) 30 tablet 0   No current facility-administered medications for this visit.    Review of Systems: GENERAL: negative for malaise, night sweats HEENT: No changes in hearing or vision, no nose bleeds or other nasal problems. NECK: Negative for lumps, goiter, pain and significant neck swelling RESPIRATORY: Negative for cough, wheezing CARDIOVASCULAR: Negative for chest pain, leg swelling, palpitations, orthopnea GI: SEE HPI MUSCULOSKELETAL: Negative for joint pain or swelling, back pain, and muscle pain. SKIN: Negative for lesions, rash PSYCH: Negative for sleep disturbance, mood disorder and recent psychosocial stressors. HEMATOLOGY Negative for prolonged bleeding, bruising easily, and swollen nodes. ENDOCRINE: Negative for cold or heat intolerance, polyuria, polydipsia and goiter. NEURO: negative for  tremor, gait imbalance, syncope and seizures. The remainder of the review of systems is noncontributory.   Physical Exam: BP 112/82 (BP Location: Left Arm, Patient Position: Sitting, Cuff Size: Normal)   Pulse (!) 141   Temp 98.3 F (36.8 C) (Oral)   Ht 5\' 6"  (1.676 m)   Wt 158 lb 4.8 oz (71.8 kg)   BMI 25.55 kg/m  GENERAL: The patient is awake, in no acute distress. HEENT: Head is normocephalic and atraumatic. EOMI are intact. Mouth is well hydrated and without lesions. NECK: Supple. No masses LUNGS: Clear to auscultation. No presence of rhonchi/wheezing/rales. Adequate chest expansion HEART: RRR, normal s1 and s2. ABDOMEN: Soft, nontender, no guarding, no peritoneal signs, and nondistended. BS +. No masses. EXTREMITIES: Without any cyanosis, clubbing, rash, lesions or edema. NEUROLOGIC: Awake, no focal motor deficit. SKIN: no jaundice, no rashes  Imaging/Labs: as above  I personally reviewed and interpreted the available labs, imaging and endoscopic files.  Impression and Plan: Stepan Broderson is a 41 y.o. male past medical history of autism and recent diagnosis of Crohn's disease, who presents for follow up after recent hospitalization for  Crohn's disease.  Most of the appointment today consistent on discussing the available options for management of his Crohn's disease.  Donald Gallagher will require treatment with steroid-sparing medications. Discussion was held regarding benefits and side  effects of immunomodulators/biologicals (including infections, malignancies such as lymphoma, skin cancer, tolerance to medication), as well as need to control her disease clinically and endoscopically (ideally microscopically as well) to avoid recurrence of her symptomatology.  Given his autism, the administration of this medication may be challenging, especially given the use of needles.  The mother and father decided that it was more likely he will be able to tolerate an IV infusion.  They would  like to proceed with infliximab 5 mg/kg induction and every 8 weeks thereafter. Given his severe disease with fistulizing component with perianal involvement, we will try combination therapy with low-dose azathioprine 50 mg every day.  He will continue his prednisone taper but will continue on 10 mg dosing until he starts the Remicade infusion.  The family will reach me at that point and I will direct this taper at that point.  - Start Infliximab protocol 5 mg/kg induction and every 8 weeks - Start Imuran 50 mg qday - Continue prednisone taper as instructed. Should continue prednisone 10 mg but PLEASE CALL ONCE YOU GET YOUR FIRST INFLIXIMAB INFUSION - Follow up with Dr. Cliffton Asters - colorectal surgery   All questions were answered.      Donald Frame, MD Gastroenterology and Hepatology [facility]

## 2021-11-28 ENCOUNTER — Telehealth: Payer: Self-pay | Admitting: Pharmacy Technician

## 2021-11-28 NOTE — Telephone Encounter (Signed)
Auth Submission: approved/denied: NO AUTH NEEDED Payer: Medicare a/b & Medicaid [place] access Medication & CPT/J Code(s) submitted: Remicade (Infliximab) J1745 Approval from: 12/01/21 to 12/01/22

## 2021-11-28 NOTE — Telephone Encounter (Signed)
Thank you :)

## 2021-12-02 ENCOUNTER — Emergency Department (HOSPITAL_COMMUNITY)
Admission: EM | Admit: 2021-12-02 | Discharge: 2021-12-12 | Disposition: E | Payer: Medicare Other | Attending: Emergency Medicine | Admitting: Emergency Medicine

## 2021-12-02 DIAGNOSIS — J9601 Acute respiratory failure with hypoxia: Secondary | ICD-10-CM

## 2021-12-02 DIAGNOSIS — I4901 Ventricular fibrillation: Secondary | ICD-10-CM

## 2021-12-02 DIAGNOSIS — R55 Syncope and collapse: Secondary | ICD-10-CM

## 2021-12-02 DIAGNOSIS — I469 Cardiac arrest, cause unspecified: Secondary | ICD-10-CM

## 2021-12-02 LAB — CBG MONITORING, ED: Glucose-Capillary: 135 mg/dL — ABNORMAL HIGH (ref 70–99)

## 2021-12-02 MED ORDER — EPINEPHRINE 1 MG/10ML IJ SOSY
PREFILLED_SYRINGE | INTRAMUSCULAR | Status: AC | PRN
  Administered 2021-12-02 (×8): 1 mg via INTRAVENOUS

## 2021-12-02 MED ORDER — SODIUM BICARBONATE 8.4 % IV SOLN
INTRAVENOUS | Status: AC | PRN
  Administered 2021-12-02 (×2): 50 meq via INTRAVENOUS

## 2021-12-02 MED ORDER — ROCURONIUM BROMIDE 50 MG/5ML IV SOLN
INTRAVENOUS | Status: AC | PRN
  Administered 2021-12-02: 100 mg via INTRAVENOUS

## 2021-12-02 MED ORDER — EPINEPHRINE 1 MG/10ML IJ SOSY
PREFILLED_SYRINGE | INTRAMUSCULAR | Status: AC | PRN
  Administered 2021-12-02: 1 mg via INTRAVENOUS

## 2021-12-02 MED ORDER — MAGNESIUM SULFATE 50 % IJ SOLN
INTRAMUSCULAR | Status: AC | PRN
  Administered 2021-12-02: 2 g via INTRAVENOUS

## 2021-12-02 MED ORDER — MAGNESIUM SULFATE 2 GM/50ML IV SOLN
INTRAVENOUS | Status: AC
  Filled 2021-12-02: qty 50

## 2021-12-02 MED ORDER — ETOMIDATE 2 MG/ML IV SOLN
INTRAVENOUS | Status: AC | PRN
  Administered 2021-12-02: 10 mg via INTRAVENOUS

## 2021-12-02 NOTE — Code Documentation (Signed)
Vfib on monitor - pt shocked  

## 2021-12-02 NOTE — ED Notes (Signed)
Epi gtt started at 10

## 2021-12-02 NOTE — ED Notes (Signed)
Family remains at bedside at this time - Provided with drinks per request

## 2021-12-02 NOTE — Code Documentation (Signed)
Cards at bedside

## 2021-12-02 NOTE — Code Documentation (Signed)
Minimal cardiac activity - Not resuming CPR per Dr Wallace Cullens - Not calling time of death at this time

## 2021-12-02 NOTE — Progress Notes (Signed)
Chaplain discovered pt's family in waiting area.  Notified pt's physician family was present and accompanied physician when she told the parents the gravity of their son's condition.  Chaplain accompanied family to pt's bedside. Chaplain invited pt's sister to have Chaplain if there is a need. Sister reported she is the spiritual strength of the family and would make sure the parents are well taken care of.  Chaplain on standby if needed.  Vernell Morgans Chaplain

## 2021-12-02 NOTE — Code Documentation (Signed)
Atropine given 

## 2021-12-02 NOTE — ED Triage Notes (Signed)
Pt BIB EMS - EMS originally called out for a syncopal episode. PT had a fall and hit head and mother found. Pt was initially sitting up alert and talking to EMS but then EMS reports pt was unresponsive. Pt showed STEMI on 12 lead. Pt has hx of autism and recent chron's dx.  EMS started IO. On arrival to room in ED pt had no pulse. CPR started

## 2021-12-02 NOTE — Code Documentation (Signed)
Pulses lost - CPR resumed - BP 52/30

## 2021-12-02 NOTE — Code Documentation (Signed)
Vfib on monitor - pt shocked

## 2021-12-02 NOTE — Code Documentation (Signed)
300 amio given 

## 2021-12-02 NOTE — Code Documentation (Signed)
Calcium gluconate given

## 2021-12-02 NOTE — Code Documentation (Signed)
2g Mag completed

## 2021-12-02 NOTE — ED Provider Notes (Addendum)
[facility] EMERGENCY DEPARTMENT Provider Note   CSN: [id number] Arrival date & time: 27-Dec-2021  2121     History  Chief Complaint  Patient presents with   Unresponsive    Code STEMI    Donald Gallagher is a 41 y.o. male.  Patient is a 41 year old male with past medical history of autism presenting via EMS after reported syncopal event.  EMS reports that patient was awake and ambulating on their arrival reports pulses in route to emergency department.  On arrival to the emergency department patient was slumped over in the bed and pulseless.  Chest compressions were started immediately.  Patient was being bagged on arrival.    Per family patient was in good health this morning.  He was seen by his home nurse.  He had no fevers, chills, coughing, vomiting, or signs of illness.  No complaints of chest pain or shortness of breath.  Known history of several GI bleeds, autism, and recently dx of chron's disease.   The history is provided by the patient. No language interpreter was used.       Home Medications Prior to Admission medications   Not on File      Allergies    Patient has no allergy information on record.    Review of Systems   Review of Systems  Unable to perform ROS: Patient unresponsive    Physical Exam Updated Vital Signs BP (!) 85/38   Pulse 77   Resp (!) 5   SpO2 96%  Physical Exam Vitals and nursing note reviewed.  HENT:     Head: Normocephalic and atraumatic.     Mouth/Throat:     Lips: Pink.     Pharynx: Oropharynx is clear.  Eyes:     Comments: Fixed to +2 pupils  Cardiovascular:     Comments: No pulses on arrival. Pulmonary:     Comments: No spontaneous breaths Chest:     Chest wall: No mass, deformity or crepitus.  Abdominal:     General: Abdomen is flat.     Palpations: Abdomen is soft.  Genitourinary:    Comments: Indwelling Foley catheter Skin:    Findings: No rash.  Neurological:     Mental Status: He is  unresponsive.     GCS: GCS eye subscore is 1. GCS verbal subscore is 1. GCS motor subscore is 1.     Comments: No purposeful movement.  Patient does not withdraw from pain.     ED Results / Procedures / Treatments   Labs (all labs ordered are listed, but only abnormal results are displayed) Labs Reviewed - No data to display  EKG None  Radiology No results found.  Procedures .Critical Care  Performed by: Franne Forts, DO Authorized by: Franne Forts, DO   Critical care provider statement:    Critical care time (minutes):  104   Critical care was necessary to treat or prevent imminent or life-threatening deterioration of the following conditions:  Cardiac failure and respiratory failure   Critical care was time spent personally by me on the following activities:  Development of treatment plan with patient or surrogate, discussions with consultants, evaluation of patient's response to treatment, examination of patient, ordering and review of laboratory studies, ordering and review of radiographic studies, ordering and performing treatments and interventions, pulse oximetry, re-evaluation of patient's condition and review of old charts Procedure Name: Intubation Date/Time: 12/03/2021 12:02 AM  Performed by: Franne Forts, DOPre-anesthesia Checklist: Patient identified, Suction available  and Patient being monitored Laryngoscope Size: Glidescope Tube type: Subglottic suction tube Number of attempts: 1 Placement Confirmation: ETT inserted through vocal cords under direct vision, Positive ETCO2 and Breath sounds checked- equal and bilateral Tube secured with: ETT holder        Medications Ordered in ED Medications  magnesium sulfate 2 GM/50ML IVPB (has no administration in time range)  EPINEPHrine (ADRENALIN) 1 MG/10ML injection (1 mg Intravenous Given Dec 10, 2021 2155)  sodium bicarbonate injection (50 mEq Intravenous Given Dec 10, 2021 2154)    ED Course/ Medical Decision Making/  A&P                           Medical Decision Making Risk Prescription drug management.   10:31 PM Pt taken to resus room and ACLS protocol was started. Patient presents in arrest.  It was reported to have pulses in the ambulance prior to arrival.  Compressions were started immediately.  Epinephrine was given.  A-fib was on the monitor during the first pulse check.  Patient got defibrillated and was given amiodarone.  Refer to ED code summary for full details on medications and procedures.  ET tube placed. Frank blood from ET tube. Patient was coded for approximately 50 minutes.  Was defibrillated several times for pulseless V-fib.  Multiple episodes of pulseless electrical activity.  Had some instances in which we were able to achieve ROSC but only temporarily.  Patient would quickly bradycardia down and lost pulses.  Cause of death called at 1016.  Asystole was confirmed by 3 lead EKG and/or bedside US. Cause of death at this time is unknown. Likely cause of death is cardiac arrest.    I spoke with ME John. This is not a medical examiner case. Family will sign the death certificate. Family member, Synetta Fail who is pt's mother was at bedside to identify the pt.         Final Clinical Impression(s) / ED Diagnoses Final diagnoses:  Cardiac arrest (HCC)  Acute respiratory failure with hypoxia (HCC)  Syncope, unspecified syncope type  Ventricular fibrillation [facility])  Pulseless electrical activity [facility])    Rx / DC Orders ED Discharge Orders     None         Franne Forts, DO 12/03/21 0001    Franne Forts, DO 12/03/21 0002

## 2021-12-02 NOTE — Code Documentation (Signed)
Pt intubated by Dr Wallace Cullens - 25 at the lip - Positive color change and bilateral breath sounds

## 2021-12-02 NOTE — ED Notes (Addendum)
Dr Wallace Cullens to go update family - Pt remains omn monitor at this time

## 2021-12-02 NOTE — ED Notes (Signed)
Cards at bedside

## 2021-12-02 NOTE — Code Documentation (Signed)
Pulses lost. CPR restarted 

## 2021-12-02 NOTE — Code Documentation (Signed)
Patient time of death occurred at 07[birth year]Aug-2214. Confirmed by cardiac ultrasound by MD Wallace Cullens

## 2021-12-02 NOTE — Code Documentation (Signed)
Epi gtt started.

## 2021-12-02 NOTE — Code Documentation (Signed)
Dopamine gtt started

## 2021-12-02 NOTE — ED Notes (Signed)
Dr Wallace Cullens brought family to bedside - per Dr Wallace Cullens monitor to be switched to comfort care - Dr Wallace Cullens preforming bedside cardiac ultrasound - No cardiac activity

## 2021-12-02 NOTE — Code Documentation (Signed)
CPR restarted.

## 2021-12-03 ENCOUNTER — Encounter: Payer: Self-pay | Admitting: Gastroenterology

## 2021-12-03 DIAGNOSIS — I4901 Ventricular fibrillation: Secondary | ICD-10-CM | POA: Diagnosis not present

## 2021-12-03 DIAGNOSIS — R55 Syncope and collapse: Secondary | ICD-10-CM | POA: Diagnosis present

## 2021-12-03 DIAGNOSIS — J9601 Acute respiratory failure with hypoxia: Secondary | ICD-10-CM | POA: Diagnosis not present

## 2021-12-03 DIAGNOSIS — I469 Cardiac arrest, cause unspecified: Secondary | ICD-10-CM | POA: Diagnosis not present

## 2021-12-08 ENCOUNTER — Inpatient Hospital Stay: Payer: Medicare Other | Admitting: Family

## 2021-12-08 ENCOUNTER — Ambulatory Visit (INDEPENDENT_AMBULATORY_CARE_PROVIDER_SITE_OTHER): Payer: Medicare Other | Admitting: Gastroenterology

## 2021-12-09 ENCOUNTER — Other Ambulatory Visit: Payer: Self-pay | Admitting: Family

## 2021-12-09 ENCOUNTER — Ambulatory Visit: Payer: Medicare Other

## 2021-12-12 NOTE — ED Notes (Signed)
Pt mother and relative updated on plan of care. This RN took information down of the funeral home family would like the pt sent to. Pt family denies any other needs at this time

## 2021-12-12 NOTE — ED Notes (Signed)
Per Dr Wallace Cullens not a ME case

## 2021-12-12 NOTE — ED Notes (Signed)
Pt transferred down to morgue

## 2021-12-12 NOTE — ED Notes (Signed)
Pt placement notified of post mortem checklist completion - Call pt placement when pt is going down to morgue

## 2021-12-12 NOTE — ED Notes (Signed)
Family came to nurse station to inquire about next steps. Card for patient placement was given and number to morgue. Family states that they are already aware of where they would like the body to be taken. Family requested that nurse that was assigned to patient came and speak to the mother with same information.

## 2021-12-12 DEATH — deceased

## 2022-03-02 ENCOUNTER — Ambulatory Visit (INDEPENDENT_AMBULATORY_CARE_PROVIDER_SITE_OTHER): Payer: Medicare Other | Admitting: Gastroenterology

## 2023-08-22 IMAGING — DX DG CHEST 2V
2 series · 2 of 2 positions shown · non-contrast
Comparison: None Available.

CLINICAL DATA: Cough, fever

EXAM:
CHEST - 2 VIEW

[chest pa]
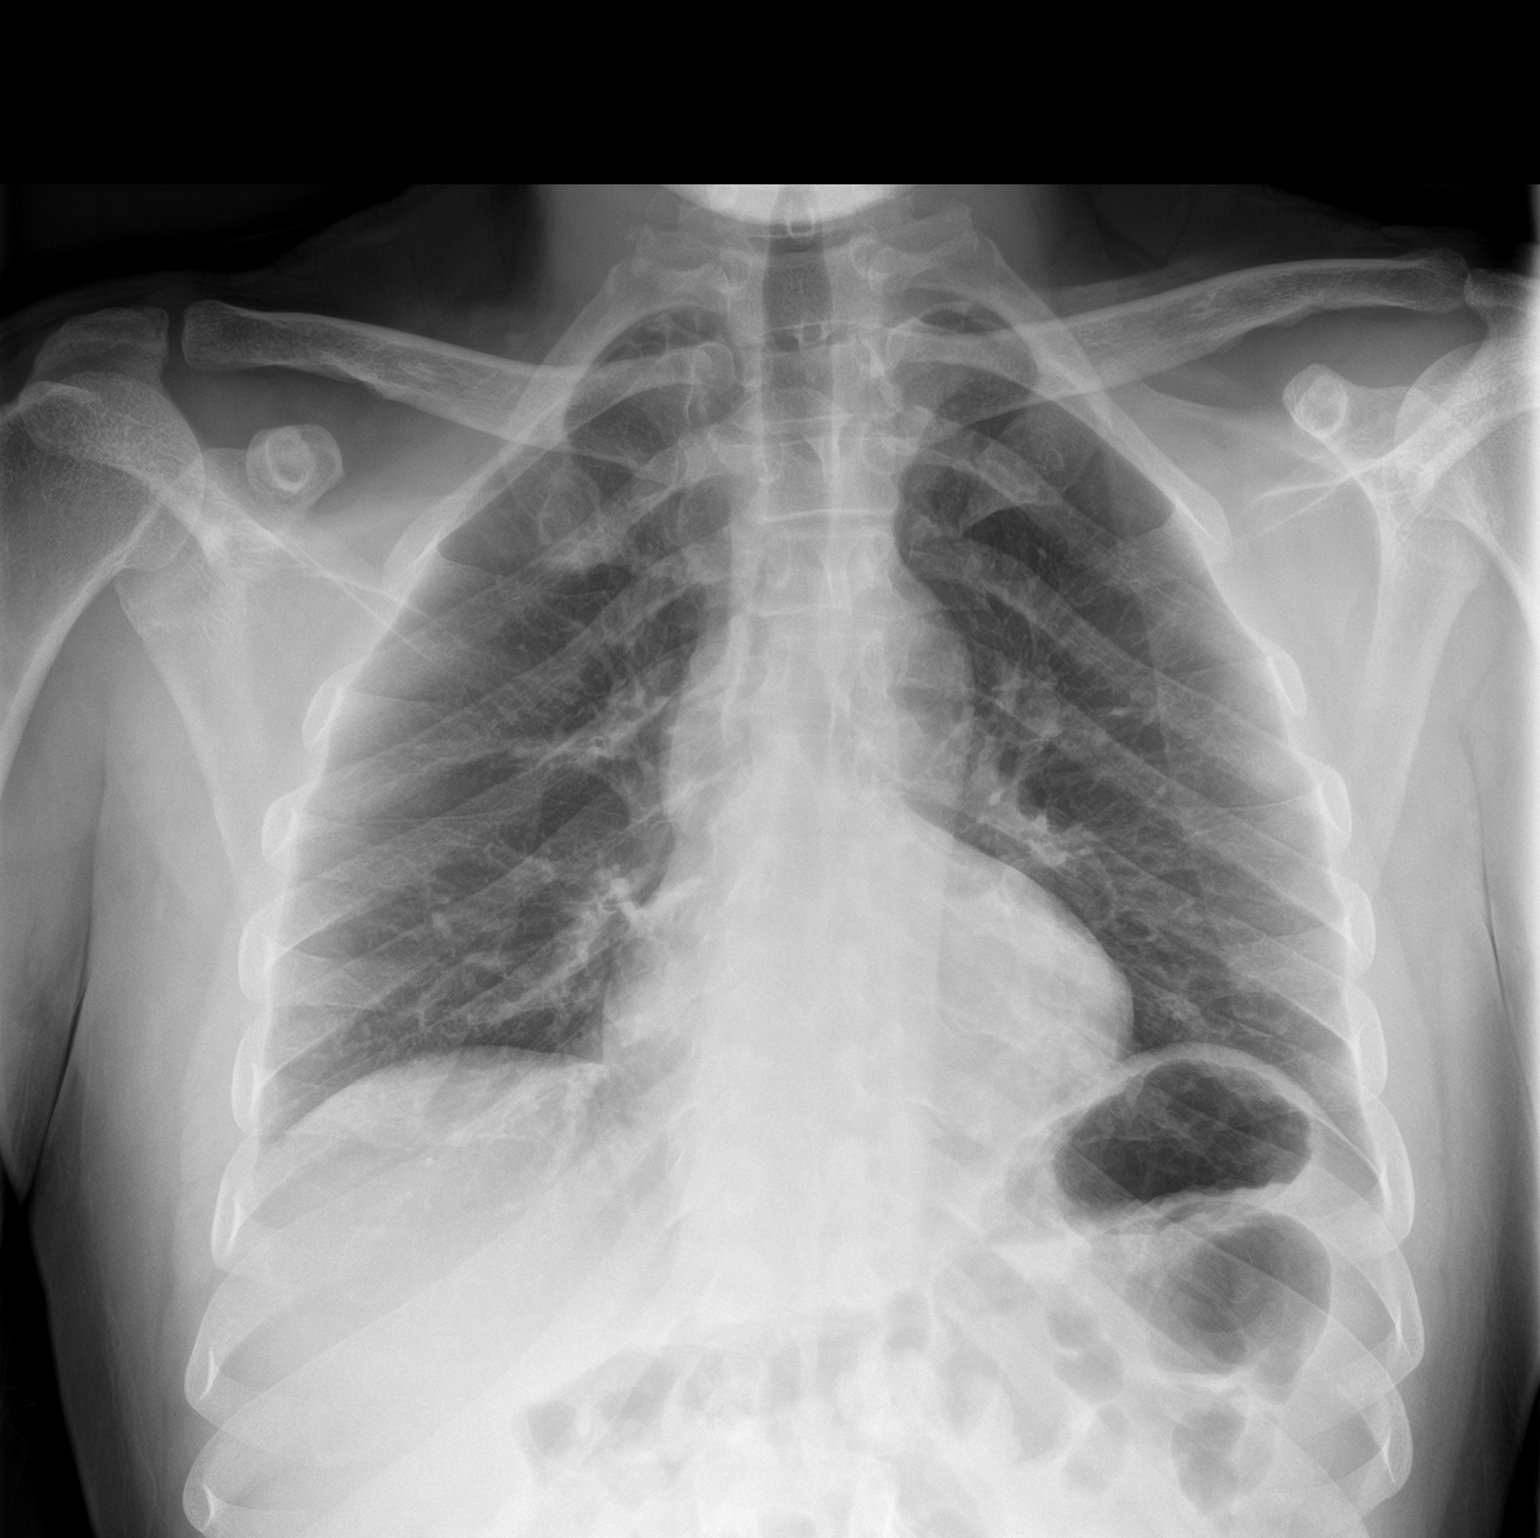

[chest lat]
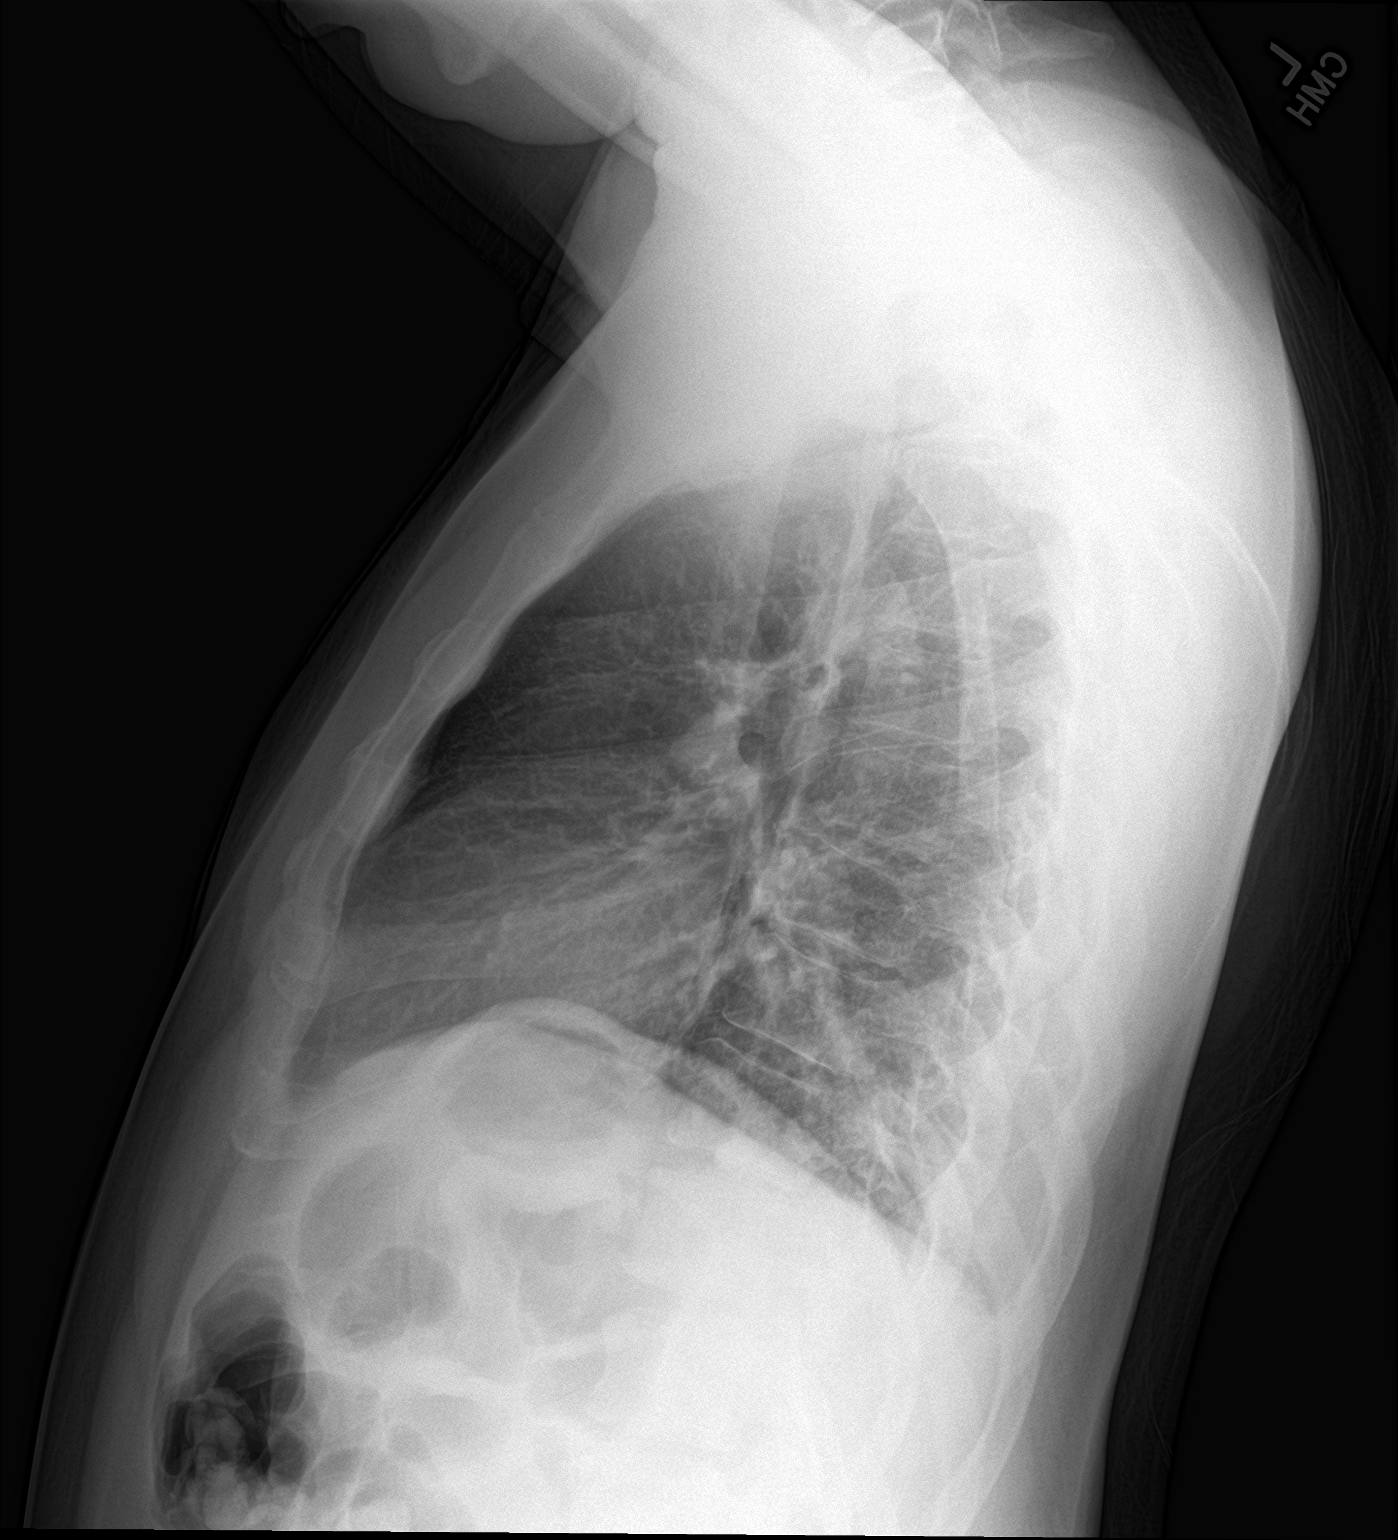

[2 of 2 positions shown; findings below may reference images not displayed]

FINDINGS: Cardiac size is within normal limits. There are no signs of
pulmonary edema. Peribronchial thickening suggests bronchitis. There
is no focal pulmonary consolidation. Crowding of markings seen in
the posterior lower lung fields in the lateral view may be due to
poor inspiration or suggest subsegmental atelectasis. There is no
pleural effusion or pneumothorax. Blebs are seen in both apices.
IMPRESSION: There are no signs of pulmonary edema or focal pulmonary
consolidation. There is no pleural effusion.

In the lateral view, increased markings are seen in the posterior
lower lung fields which may be due to poor inspiration or suggest
subsegmental atelectasis.
